# Patient Record
Sex: Male | Born: 1960 | Race: White | Hispanic: No | Marital: Single | Smoking: Current every day smoker
Health system: Southern US, Community
[De-identification: ages and names within clinical notes are randomized; demographics above are authoritative.]

## PROBLEM LIST (undated history)

## (undated) DIAGNOSIS — M549 Dorsalgia, unspecified: Secondary | ICD-10-CM

## (undated) HISTORY — PX: LUMBAR DISC SURGERY: SHX700

---

## 2000-12-11 ENCOUNTER — Emergency Department (HOSPITAL_COMMUNITY): Admission: EM | Admit: 2000-12-11 | Discharge: 2000-12-11 | Payer: Self-pay | Admitting: *Deleted

## 2000-12-12 ENCOUNTER — Emergency Department (HOSPITAL_COMMUNITY): Admission: EM | Admit: 2000-12-12 | Discharge: 2000-12-12 | Payer: Self-pay | Admitting: *Deleted

## 2011-09-18 ENCOUNTER — Encounter (HOSPITAL_COMMUNITY): Payer: Self-pay | Admitting: *Deleted

## 2011-09-18 ENCOUNTER — Emergency Department (HOSPITAL_COMMUNITY)
Admission: EM | Admit: 2011-09-18 | Discharge: 2011-09-18 | Disposition: A | Discharge status: Home / Self Care | Payer: BC Managed Care – PPO | Attending: Emergency Medicine | Admitting: Emergency Medicine

## 2011-09-18 DIAGNOSIS — F17200 Nicotine dependence, unspecified, uncomplicated: Secondary | ICD-10-CM | POA: Insufficient documentation

## 2011-09-18 DIAGNOSIS — K4090 Unilateral inguinal hernia, without obstruction or gangrene, not specified as recurrent: Secondary | ICD-10-CM | POA: Insufficient documentation

## 2011-09-18 HISTORY — DX: Dorsalgia, unspecified: M54.9

## 2011-09-18 NOTE — ED Provider Notes (Signed)
History  This chart was scribed for Joya Gaskins, MD by Bennett Scrape. This patient was seen in room APA18/[facility]18 and the patient's care was started at 10:22AM.  CSN: [id number]  Arrival date & time 09/18/11  1015   First MD Initiated Contact with Patient 09/18/11 1022      Chief Complaint  Patient presents with  . Abdominal Pain    Patient is a 51 y.o. male presenting with groin pain. The history is provided by the patient. No language interpreter was used.  Groin Pain This is a new problem. The current episode started more than 1 week ago. The problem occurs constantly. The problem has been gradually worsening. The symptoms are aggravated by walking. The symptoms are relieved by rest. He has tried nothing for the symptoms.    CHADDRICK BRUE is a 51 y.o. male who presents to the Emergency Department complaining of 2 months of gradual onset, gradually worsening, constant right inguinal pain with a noticeable bulge in the area with standing. The pain is non-radiating He describes the pain as being similar to a pulled muscle that is worse with use and better with rest. He has not seen a MD for the symptoms prior to today. He states that he has chronic back pain and urinary frequency but denies any change in the symptoms. He denies fever, emesis, constipation, abdominal pain, urgency, and dysuria as associated symptoms. He states that he has been having normal BMs. He denies having prior abdominal surgeries. He has no h/o chronic medical conditions. He is a current everyday smoker and occasional alcohol user.  Pt works as a Scientist, clinical (histocompatibility and immunogenetics) for [employer]  Past Medical History  Diagnosis Date  . Back pain     History reviewed. No pertinent past surgical history.  No family history on file.  History  Substance Use Topics  . Smoking status: Current Everyday Smoker  . Smokeless tobacco: Not on file  . Alcohol Use: Yes      Review of Systems  A complete 10 system review of  systems was obtained and all systems are negative except as noted in the HPI and PMH.   Allergies  Review of patient's allergies indicates not on file.  Home Medications  No current outpatient prescriptions on file.  Triage Vitals: BP 151/98  Pulse 86  Temp 98.1 F (36.7 C) (Oral)  Resp 16  SpO2 98%  Physical Exam  Nursing note and vitals reviewed.  CONSTITUTIONAL: Well developed/well nourished HEAD AND FACE: Normocephalic/atraumatic EYES: EOMI/PERRL ENMT: Mucous membranes moist NECK: supple no meningeal signs SPINE:entire spine nontender CV: S1/S2 noted, no murmurs/rubs/gallops noted LUNGS: Lungs are clear to auscultation bilaterally, no apparent distress ABDOMEN: soft, nontender, no rebound or guarding GU:no cva tenderness, bilateral inguinal hernias that are easily reducible.  No overlying warmth/erythema.  No testicular tenderness noted NEURO: Pt is awake/alert, moves all extremitiesx4 EXTREMITIES: pulses normal, full ROM SKIN: warm, color normal PSYCH: no abnormalities of mood noted  ED Course  Procedures   DIAGNOSTIC STUDIES: Oxygen Saturation is 98% on room air, normal by my interpretation.    COORDINATION OF CARE: 10:30AM-Discussed discharge plan with pt and pt agreed to plan. I will provide a referral to a surgeon to have the hernias repaired.   No signs of incarcerated/strangulated hernia  The patient appears reasonably screened and/or stabilized for discharge and I doubt any other medical condition or other [facility] requiring further screening, evaluation, or treatment in the ED at this time prior to discharge.  MDM  Nursing notes including past medical history and social history reviewed and considered in documentation       I personally performed the services described in this documentation, which was scribed in my presence. The recorded information has been reviewed and considered.      Joya Gaskins, MD 09/18/11 1045

## 2011-09-18 NOTE — ED Notes (Signed)
Pt states "lump, possible hernia" to right groin area. First noticed 2 mo ago.

## 2011-09-26 ENCOUNTER — Encounter (HOSPITAL_COMMUNITY): Payer: Self-pay | Admitting: Pharmacy Technician

## 2011-09-26 NOTE — H&P (Signed)
  NTS SOAP Note  Vital Signs:  Vitals as of: 09/26/2011: Systolic 153: Diastolic 94: Heart Rate 85: Temp 98.49F: Height 65ft 9.5in: Weight 124Lbs 0 Ounces: Pain Level 5: BMI 18  BMI : 18.05 kg/m2  Subjective: This 89 Years 68 Months old Male presents for of    HERNIA: ,Has had right groin pain for the past two months.  States it occurred soon after lifting something heavy.  No nausea, vomiting.  Made worse with straining.  Review of Symptoms:  Constitutional:unremarkable   Head:unremarkable    Eyes:  pain bilateral Nose/Mouth/Throat:unremarkable Cardiovascular:  unremarkable   Respiratory:  dyspnea,cough Gastrointestin    abdominal pain Genitourinary:    urinary hesitancy   joint and back pain Skin:unremarkable Hematolgic/Lymphatic:unremarkable     Allergic/Immunologic:unremarkable     Past Medical History:    Reviewed   Past Medical History  Surgical History: none Medical Problems: none Allergies: nkda Medications: none   Social History:Reviewed  Social History  Preferred Language: English ([place]) Race:  White Ethnicity: Not Hispanic / Latino Age: 51 Years 8 Months Marital Status:  S Alcohol:  Yes, How much 6 pack per day  Recreational drug(s):  No   Smoking Status: Current every day smoker reviewed on 09/26/2011 Started Date: 04/08/1978 Packs per day: 1.00   Family History:  Reviewed   Family History  Is there a family history ZO:[id number]    Objective Information: General:  Well appearing, well nourished in no distress. Head:Atraumatic; no masses; no abnormalities Neck:  Supple without lymphadenopathy.  Heart:  RRR, no murmur or gallop.  Normal S1, S2.  No S3, S4.  Lungs:    CTA bilaterally, no wheezes, rhonchi, rales.  Breathing unlabored. Abdomen:Soft, NT/ND, normal bowel sounds, no HSM, no masses.  No peritoneal signs.  Reducible right inguinal hernia, weak left inguinal  floor. XB:[id number]    Assessment:Right inguinal hernia  Diagnosis &amp; Procedure: DiagnosisCode: 550.90, ProcedureCode: 86578,    Plan:Scheduled for right inguinal herniorrhaphy on 10/02/11.   Patient Education:Alternative treatments to surgery were discussed with patient (and family).  Risks and benefits  of procedure were fully explained to the patient (and family) who gave informed consent. Patient/family questions were addressed.  Follow-up:Pending Surgery

## 2011-09-27 NOTE — Patient Instructions (Addendum)
Douglas Holt  09/27/2011   Your procedure is scheduled on:  10/02/2011  Report to [name] at  8;35 AM.  Call this number if you have problems the morning of surgery: [phone]   Remember:   Do not eat or drink:After Midnight.   Take these medicines the morning of surgery with A SIP OF WATER: No medications morning of surgery.   Do not wear jewelry, make-up or nail polish.  Do not wear lotions, powders, or perfumes. You may wear deodorant.  Do not shave 48 hours prior to surgery. Men may shave face and neck.  Do not bring valuables to the hospital.  Contacts, dentures or bridgework may not be worn into surgery.   Patients discharged the day of surgery will not be allowed to drive home.   Special Instructions: CHG Shower Use Special Wash: 1/2 bottle night before surgery and 1/2 bottle morning of surgery.   Please read over the following fact sheets that you were given: Pain Booklet, MRSA Information, Surgical Site Infection Prevention, Anesthesia Post-op Instructions and Care and Recovery After Surgery    Hernia, Surgical Repair A hernia occurs when an internal organ pushes out through a weak spot in the belly (abdominal) wall muscles. Hernias commonly occur in the groin and around the navel. Hernias often can be pushed back into place (reduced). Most hernias tend to get worse over time. Problems occur when abdominal contents get stuck in the opening (incarcerated hernia). The blood supply gets cut off (strangulated hernia). This is an emergency and needs surgery. Otherwise, hernia repair can be an elective procedure. This means you can schedule this at your convenience when an emergency is not present. Because complications can occur, if you decide to repair the hernia, it is best to do it soon. When it becomes an emergency procedure, there is increased risk of complications after surgery. CAUSES   Heavy lifting.   Obesity.   Prolonged coughing.   Straining to move your  bowels.   Hernias can also occur through a cut (incision) by a surgeonafter an abdominal operation.  HOME CARE INSTRUCTIONS Before the repair:  Bed rest is not required. You may continue your normal activities, but avoid heavy lifting (more than 10 pounds) or straining. Cough gently. If you are a smoker, it is best to stop. Even the best hernia repair can break down with the continual strain of coughing.   Do not wear anything tight over your hernia. Do not try to keep it in with an outside bandage or truss. These can damage abdominal contents if they are trapped in the hernia sac.   Eat a normal diet. Avoid constipation. Straining over long periods of time to have a bowel movement will increase hernia size. It also can breakdown repairs. If you cannot do this with diet alone, laxatives or stool softeners may be used.  PRIOR TO SURGERY, SEEK IMMEDIATE MEDICAL CARE IF: You have problems (symptoms) of a trapped (incarcerated) hernia. Symptoms include:  An oral temperature above 102 F (38.9 C) develops, or as your caregiver suggests.   Increasing abdominal pain.   Feeling sick to your stomach(nausea) and vomiting.   You stop passing gas or stool.   The hernia is stuck outside the abdomen, looks discolored, feels hard, or is tender.   You have any changes in your bowel habits or in the hernia that is unusual for you.  LET YOUR CAREGIVERS KNOW ABOUT THE FOLLOWING:  Allergies.   Medications  taken including herbs, eye drops, over the counter medications, and creams.   Use of steroids (by mouth or creams).   Family or personal history of problems with anesthetics or Novocaine.   Possibility of pregnancy, if this applies.   Personal history of blood clots (thrombophlebitis).   Family or personal history of bleeding or blood problems.   Previous surgery.   Other health problems.  BEFORE THE PROCEDURE You should be present 1 hour prior to your procedure, or as directed by your  caregiver.  AFTER THE PROCEDURE After surgery, you will be taken to the recovery area. A nurse will watch and check your progress there. Once you are awake, stable, and taking fluids well, you will be allowed to go home as long as there are no problems. Once home, an ice pack (wrapped in a light towel) applied to your operative site may help with discomfort. It may also keep the swelling down. Do not lift anything heavier than 10 pounds (4.55 kilograms). Take showers not baths. Do not drive while taking narcotics. Follow instructions as suggested by your caregiver.  SEEK IMMEDIATE MEDICAL CARE IF: After surgery:  There is redness, swelling, or increasing pain in the wound.   There is pus coming from the wound.   There is drainage from a wound lasting longer than 1 day.   An unexplained oral temperature above 102 F (38.9 C) develops.   You notice a foul smell coming from the wound or dressing.   There is a breaking open of a wound (edged not staying together) after the sutures have been removed.   You notice increasing pain in the shoulders (shoulder strap areas).   You develop dizzy episodes or fainting while standing.   You develop persistent nausea or vomiting.   You develop a rash.   You have difficulty breathing.   You develop any reaction or side effects to medications given.  MAKE SURE YOU:   Understand these instructions.   Will watch your condition.   Will get help right away if you are not doing well or get worse.  Document Released: 09/18/2000 Document Revised: 03/14/2011 Document Reviewed: 08/11/2007 [facility] Patient Information 2012 [place], [place].    PATIENT INSTRUCTIONS POST-ANESTHESIA  IMMEDIATELY FOLLOWING SURGERY:  Do not drive or operate machinery for the first twenty four hours after surgery.  Do not make any important decisions for twenty four hours after surgery or while taking narcotic pain medications or sedatives.  If you develop intractable  nausea and vomiting or a severe headache please notify your doctor immediately.  FOLLOW-UP:  Please make an appointment with your surgeon as instructed. You do not need to follow up with anesthesia unless specifically instructed to do so.  WOUND CARE INSTRUCTIONS (if applicable):  Keep a dry clean dressing on the anesthesia/puncture wound site if there is drainage.  Once the wound has quit draining you may leave it open to air.  Generally you should leave the bandage intact for twenty four hours unless there is drainage.  If the epidural site drains for more than 36-48 hours please call the anesthesia department.  QUESTIONS?:  Please feel free to call your physician or the hospital operator if you have any questions, and they will be happy to assist you.

## 2011-09-30 ENCOUNTER — Encounter (HOSPITAL_COMMUNITY)
Admission: RE | Admit: 2011-09-30 | Discharge: 2011-09-30 | Disposition: A | Discharge status: Home / Self Care | Payer: BC Managed Care – PPO | Source: Ambulatory Visit | Attending: General Surgery | Admitting: General Surgery

## 2011-09-30 ENCOUNTER — Encounter (HOSPITAL_COMMUNITY): Payer: Self-pay

## 2011-09-30 LAB — BASIC METABOLIC PANEL
Chloride: 90 mEq/L — ABNORMAL LOW (ref 96–112)
Creatinine, Ser: 0.81 mg/dL (ref 0.50–1.35)
GFR calc Af Amer: >90 mL/min (ref >90)
Potassium: 4.7 mEq/L (ref 3.5–5.1)

## 2011-09-30 LAB — DIFFERENTIAL
Basophils Absolute: 0 10*3/uL (ref 0.0–0.1)
Lymphocytes Relative: 15 % (ref 12–46)
Monocytes Absolute: 0.6 10*3/uL (ref 0.1–1.0)
Neutro Abs: 3.6 10*3/uL (ref 1.7–7.7)
Neutrophils Relative %: 72 % (ref 43–77)

## 2011-09-30 LAB — CBC
HCT: 40.3 % (ref 39.0–52.0)
Platelets: 201 10*3/uL (ref 150–400)
RDW: 12.7 % (ref 11.5–15.5)
WBC: 5 10*3/uL (ref 4.0–10.5)

## 2011-09-30 LAB — SURGICAL PCR SCREEN
MRSA, PCR: NEGATIVE
Staphylococcus aureus: NEGATIVE

## 2011-10-01 NOTE — Progress Notes (Signed)
Upon reviewing pt's pre-op labwork, sodium level was seen to be 126. Dr. Jayme Cloud notified and no orders given. He said he would evaluate pt morning of surgery.

## 2011-10-02 ENCOUNTER — Ambulatory Visit (HOSPITAL_COMMUNITY): Payer: BC Managed Care – PPO | Admitting: Anesthesiology

## 2011-10-02 ENCOUNTER — Encounter (HOSPITAL_COMMUNITY)
Admission: RE | Disposition: A | Discharge status: Home / Self Care | Payer: Self-pay | Source: Ambulatory Visit | Attending: General Surgery

## 2011-10-02 ENCOUNTER — Encounter (HOSPITAL_COMMUNITY): Payer: Self-pay | Admitting: Anesthesiology

## 2011-10-02 ENCOUNTER — Ambulatory Visit (HOSPITAL_COMMUNITY)
Admission: RE | Admit: 2011-10-02 | Discharge: 2011-10-02 | Disposition: A | Discharge status: Home / Self Care | Payer: BC Managed Care – PPO | Source: Ambulatory Visit | Attending: General Surgery | Admitting: General Surgery

## 2011-10-02 DIAGNOSIS — J449 Chronic obstructive pulmonary disease, unspecified: Secondary | ICD-10-CM | POA: Insufficient documentation

## 2011-10-02 DIAGNOSIS — J4489 Other specified chronic obstructive pulmonary disease: Secondary | ICD-10-CM | POA: Insufficient documentation

## 2011-10-02 DIAGNOSIS — K4090 Unilateral inguinal hernia, without obstruction or gangrene, not specified as recurrent: Secondary | ICD-10-CM | POA: Insufficient documentation

## 2011-10-02 HISTORY — PX: INGUINAL HERNIA REPAIR: SHX194

## 2011-10-02 SURGERY — REPAIR, HERNIA, INGUINAL, ADULT
Anesthesia: General | Site: Groin | Laterality: Right | Wound class: Clean

## 2011-10-02 MED ORDER — MIDAZOLAM HCL 2 MG/2ML IJ SOLN
INTRAMUSCULAR | Status: AC
  Administered 2011-10-02: 2 mg via INTRAVENOUS
  Filled 2011-10-02: qty 2

## 2011-10-02 MED ORDER — BUPIVACAINE HCL (PF) 0.5 % IJ SOLN
INTRAMUSCULAR | Status: DC | PRN
  Administered 2011-10-02: 9 mL

## 2011-10-02 MED ORDER — BUPIVACAINE HCL (PF) 0.5 % IJ SOLN
INTRAMUSCULAR | Status: AC
  Filled 2011-10-02: qty 30

## 2011-10-02 MED ORDER — CEFAZOLIN SODIUM-DEXTROSE 2-3 GM-% IV SOLR: 2.0000 g | INTRAVENOUS | Status: DC

## 2011-10-02 MED ORDER — LACTATED RINGERS IV SOLN
INTRAVENOUS | Status: DC
  Administered 2011-10-02: 1000 mL via INTRAVENOUS

## 2011-10-02 MED ORDER — PROPOFOL 10 MG/ML IV EMUL
INTRAVENOUS | Status: AC
  Filled 2011-10-02: qty 20

## 2011-10-02 MED ORDER — KETOROLAC TROMETHAMINE 30 MG/ML IJ SOLN
INTRAMUSCULAR | Status: AC
  Filled 2011-10-02: qty 1

## 2011-10-02 MED ORDER — ENOXAPARIN SODIUM 40 MG/0.4ML ~~LOC~~ SOLN
40.0000 mg | Freq: Once | SUBCUTANEOUS | Status: AC
  Administered 2011-10-02: 40 mg via SUBCUTANEOUS

## 2011-10-02 MED ORDER — CEFAZOLIN SODIUM-DEXTROSE 2-3 GM-% IV SOLR
INTRAVENOUS | Status: AC
  Filled 2011-10-02: qty 50

## 2011-10-02 MED ORDER — OXYCODONE-ACETAMINOPHEN 7.5-325 MG PO TABS: 1.0000 | ORAL_TABLET | ORAL | Status: AC | PRN

## 2011-10-02 MED ORDER — FENTANYL CITRATE 0.05 MG/ML IJ SOLN
INTRAMUSCULAR | Status: AC
  Filled 2011-10-02: qty 2

## 2011-10-02 MED ORDER — FENTANYL CITRATE 0.05 MG/ML IJ SOLN
INTRAMUSCULAR | Status: DC | PRN
  Administered 2011-10-02 (×2): 50 ug via INTRAVENOUS

## 2011-10-02 MED ORDER — KETOROLAC TROMETHAMINE 30 MG/ML IJ SOLN
30.0000 mg | Freq: Once | INTRAMUSCULAR | Status: AC
  Administered 2011-10-02: 30 mg via INTRAVENOUS

## 2011-10-02 MED ORDER — MIDAZOLAM HCL 2 MG/2ML IJ SOLN
1.0000 mg | INTRAMUSCULAR | Status: DC | PRN
  Administered 2011-10-02 (×2): 2 mg via INTRAVENOUS

## 2011-10-02 MED ORDER — PROPOFOL 10 MG/ML IV BOLUS
INTRAVENOUS | Status: DC | PRN
Start: 2011-10-02 — End: 2011-10-02
  Administered 2011-10-02: 150 mg via INTRAVENOUS

## 2011-10-02 MED ORDER — CEFAZOLIN SODIUM 1-5 GM-% IV SOLN
INTRAVENOUS | Status: DC | PRN
  Administered 2011-10-02: 1 g via INTRAVENOUS

## 2011-10-02 MED ORDER — ONDANSETRON HCL 4 MG/2ML IJ SOLN
INTRAMUSCULAR | Status: AC
  Administered 2011-10-02: 4 mg via INTRAVENOUS
  Filled 2011-10-02: qty 2

## 2011-10-02 MED ORDER — CEFAZOLIN SODIUM 1-5 GM-% IV SOLN
INTRAVENOUS | Status: AC
  Filled 2011-10-02: qty 50

## 2011-10-02 MED ORDER — MIDAZOLAM HCL 2 MG/2ML IJ SOLN
INTRAMUSCULAR | Status: AC
  Filled 2011-10-02: qty 2

## 2011-10-02 MED ORDER — ONDANSETRON HCL 4 MG/2ML IJ SOLN
4.0000 mg | Freq: Once | INTRAMUSCULAR | Status: AC
  Administered 2011-10-02: 4 mg via INTRAVENOUS

## 2011-10-02 MED ORDER — SODIUM CHLORIDE 0.9 % IR SOLN
Status: DC | PRN
  Administered 2011-10-02: 1000 mL

## 2011-10-02 MED ORDER — ONDANSETRON HCL 4 MG/2ML IJ SOLN: 4.0000 mg | Freq: Once | INTRAMUSCULAR | Status: DC | PRN

## 2011-10-02 MED ORDER — FENTANYL CITRATE 0.05 MG/ML IJ SOLN: 25.0000 ug | INTRAMUSCULAR | Status: DC | PRN

## 2011-10-02 MED ORDER — ENOXAPARIN SODIUM 40 MG/0.4ML ~~LOC~~ SOLN
SUBCUTANEOUS | Status: AC
  Administered 2011-10-02: 40 mg via SUBCUTANEOUS
  Filled 2011-10-02: qty 0.4

## 2011-10-02 MED ORDER — LIDOCAINE HCL 1 % IJ SOLN
INTRAMUSCULAR | Status: DC | PRN
  Administered 2011-10-02: 50 mg via INTRADERMAL

## 2011-10-02 MED ORDER — CEFAZOLIN SODIUM 1-5 GM-% IV SOLN: 1.0000 g | Freq: Once | INTRAVENOUS | Status: DC

## 2011-10-02 MED ORDER — MIDAZOLAM HCL 5 MG/5ML IJ SOLN
INTRAMUSCULAR | Status: DC | PRN
  Administered 2011-10-02: 2 mg via INTRAVENOUS

## 2011-10-02 SURGICAL SUPPLY — 41 items
ADH SKN CLS APL DERMABOND .7 (GAUZE/BANDAGES/DRESSINGS) ×1
BAG HAMPER (MISCELLANEOUS) ×2 IMPLANT
CLOTH BEACON ORANGE TIMEOUT ST (SAFETY) ×2 IMPLANT
COVER LIGHT HANDLE STERIS (MISCELLANEOUS) ×4 IMPLANT
DECANTER SPIKE VIAL GLASS SM (MISCELLANEOUS) ×2 IMPLANT
DERMABOND ADVANCED (GAUZE/BANDAGES/DRESSINGS) ×1
DERMABOND ADVANCED .7 DNX12 (GAUZE/BANDAGES/DRESSINGS) ×1 IMPLANT
DRAIN PENROSE 18X.75 LTX STRL (MISCELLANEOUS) ×2 IMPLANT
ELECT REM PT RETURN 9FT ADLT (ELECTROSURGICAL) ×2
ELECTRODE REM PT RTRN 9FT ADLT (ELECTROSURGICAL) ×1 IMPLANT
FORMALIN 10 PREFIL 120ML (MISCELLANEOUS) IMPLANT
GLOVE BIO SURGEON STRL SZ7.5 (GLOVE) ×2 IMPLANT
GLOVE BIOGEL PI IND STRL 7.0 (GLOVE) IMPLANT
GLOVE BIOGEL PI INDICATOR 7.0 (GLOVE) ×2
GLOVE ECLIPSE 6.5 STRL STRAW (GLOVE) ×2 IMPLANT
GLOVE EXAM NITRILE MD LF STRL (GLOVE) ×1 IMPLANT
GOWN STRL REIN XL XLG (GOWN DISPOSABLE) ×6 IMPLANT
INST SET MINOR GENERAL (KITS) ×2 IMPLANT
KIT ROOM TURNOVER APOR (KITS) ×2 IMPLANT
MANIFOLD NEPTUNE II (INSTRUMENTS) ×2 IMPLANT
MESH MARLEX PLUG MEDIUM (Mesh General) ×1 IMPLANT
NDL HYPO 25X1 1.5 SAFETY (NEEDLE) ×1 IMPLANT
NEEDLE HYPO 25X1 1.5 SAFETY (NEEDLE) ×2 IMPLANT
NS IRRIG 1000ML POUR BTL (IV SOLUTION) ×2 IMPLANT
PACK MINOR (CUSTOM PROCEDURE TRAY) ×2 IMPLANT
PAD ARMBOARD 7.5X6 YLW CONV (MISCELLANEOUS) ×2 IMPLANT
PAIN PUMP ON-Q 100MLX2ML 2.5IN (PAIN MANAGEMENT) IMPLANT
SET BASIN LINEN APH (SET/KITS/TRAYS/PACK) ×2 IMPLANT
SOL PREP PROV IODINE SCRUB 4OZ (MISCELLANEOUS) ×2 IMPLANT
SUT NOVA NAB GS-22 2 2-0 T-19 (SUTURE) ×2 IMPLANT
SUT NOVAFIL NAB HGS22 2-0 30IN (SUTURE) IMPLANT
SUT SILK 3 0 (SUTURE)
SUT SILK 3-0 18XBRD TIE 12 (SUTURE) ×1 IMPLANT
SUT VIC AB 2-0 CT1 27 (SUTURE) ×2
SUT VIC AB 2-0 CT1 TAPERPNT 27 (SUTURE) ×1 IMPLANT
SUT VIC AB 3-0 SH 27 (SUTURE) ×2
SUT VIC AB 3-0 SH 27X BRD (SUTURE) ×1 IMPLANT
SUT VIC AB 4-0 PS2 27 (SUTURE) ×2 IMPLANT
SUT VICRYL AB 3 0 TIES (SUTURE) IMPLANT
SYR CONTROL 10ML LL (SYRINGE) ×2 IMPLANT
TOWEL OR 17X26 4PK STRL BLUE (TOWEL DISPOSABLE) ×2 IMPLANT

## 2011-10-02 NOTE — Discharge Instructions (Signed)
Hernia, Surgical Repair Care After Refer to this sheet in the next few weeks. These discharge instructions provide you with general information on caring for yourself after you leave the hospital. Your caregiver may also give you specific instructions. Your treatment has been planned according to the most current medical practices available, but unavoidable complications sometimes occur. If you have any problems or questions after discharge, please call your caregiver. HOME CARE INSTRUCTIONS   It is normal to be sore for a couple weeks after surgery. See your caregiver if this seems to be getting worse rather than better.   Put ice on the operative site.   Put ice in a plastic bag.   Place a towel between your skin and the bag.   Leave the ice on for 15 to 20 minutes at a time, 3 to 4 times a day for the first 2 days.   Change bandages (dressings) as directed.   Keep the wound dry and clean. The wound may be washed gently with soap and water. Gently blot or dab the wound dry. Do not take baths, use swimming pools, or use hot tubs for 10 days, or as directed by your caregiver.   Only take over-the-counter or prescription medicines for pain, discomfort, or fever as directed by your caregiver.   Continue your normal diet as directed.   Do not drive until your caregiver says it is okay.   Do not lift anything more than 10 pounds or play contact sports for 3 weeks, or as directed.   Make an appointment to see your caregiver for stitches (sutures) or staple removal when instructed.  SEEK MEDICAL CARE IF:   You have increased bleeding coming from the wounds.   You have blood in your stool.   You see redness, swelling, or have increasing pain in the wounds.   You have fluid (pus) coming from the wound.   You have an oral temperature above 102 F (38.9 C).   You notice a bad smell coming from the wound or dressing.   You develop lightheadedness or feel faint.  SEEK IMMEDIATE  MEDICAL CARE IF:   You develop a rash.   You have difficulty breathing.   You develop any reaction or side effects to medicines given.  MAKE SURE YOU:   Understand these instructions.   Will watch your condition.   Will get help right away if you are not doing well or get worse.  Document Released: 10/12/2004 Document Revised: 03/14/2011 Document Reviewed: 02/22/2009 [employer] Patient Information 2012 [employer]. 

## 2011-10-02 NOTE — Anesthesia Preprocedure Evaluation (Signed)
Anesthesia Evaluation  Patient identified by MRN, date of birth, ID band Patient awake    Reviewed: Allergy & Precautions, H&P , NPO status , Patient's Chart, lab work & pertinent test results  Airway Mallampati: III TM Distance: >3 FB Neck ROM: Full    Dental  (+) Edentulous Upper and Edentulous Lower   Pulmonary COPDCurrent Smoker,  + rhonchi         Cardiovascular negative cardio ROS  Rhythm:Regular Rate:Normal     Neuro/Psych    GI/Hepatic negative GI ROS,   Endo/Other    Renal/GU      Musculoskeletal   Abdominal   Peds  Hematology   Anesthesia Other Findings   Reproductive/Obstetrics                           Anesthesia Physical Anesthesia Plan  ASA: II  Anesthesia Plan: General   Post-op Pain Management:    Induction: Intravenous  Airway Management Planned: LMA  Additional Equipment:   Intra-op Plan:   Post-operative Plan: Extubation in OR  Informed Consent: I have reviewed the patients History and Physical, chart, labs and discussed the procedure including the risks, benefits and alternatives for the proposed anesthesia with the patient or authorized representative who has indicated his/her understanding and acceptance.     Plan Discussed with:   Anesthesia Plan Comments:         Anesthesia Quick Evaluation

## 2011-10-02 NOTE — Interval H&P Note (Signed)
History and Physical Interval Note:  10/02/2011 9:29 AM  Douglas Holt  has presented today for surgery, with the diagnosis of right inguinal hernia  The various methods of treatment have been discussed with the patient and family. After consideration of risks, benefits and other options for treatment, the patient has consented to  Procedure(s) (LRB): HERNIA REPAIR INGUINAL ADULT (Right) as a surgical intervention .  The patient's history has been reviewed, patient examined, no change in status, stable for surgery.  I have reviewed the patients' chart and labs.  Questions were answered to the patient's satisfaction.     Franky Macho A

## 2011-10-02 NOTE — Transfer of Care (Signed)
Immediate Anesthesia Transfer of Care Note  Patient: Douglas Holt  Procedure(s) Performed: Procedure(s) (LRB): HERNIA REPAIR INGUINAL ADULT (Right)  Patient Location: PACU  Anesthesia Type: General  Level of Consciousness: awake and patient cooperative  Airway & Oxygen Therapy: Patient Spontanous Breathing and Patient connected to face mask oxygen  Post-op Assessment: Report given to PACU RN, Post -op Vital signs reviewed and stable and Patient moving all extremities  Post vital signs: Reviewed and stable  Complications: No apparent anesthesia complications

## 2011-10-02 NOTE — Anesthesia Postprocedure Evaluation (Signed)
  Anesthesia Post-op Note  Patient: Douglas Holt  Procedure(s) Performed: Procedure(s) (LRB): HERNIA REPAIR INGUINAL ADULT (Right)  Patient Location: PACU  Anesthesia Type: General  Level of Consciousness: awake, alert , oriented and patient cooperative  Airway and Oxygen Therapy: Patient Spontanous Breathing  Post-op Pain: 5 /10, moderate  Post-op Assessment: Post-op Vital signs reviewed, Patient's Cardiovascular Status Stable, Respiratory Function Stable, Patent Airway, No signs of Nausea or vomiting and Pain level controlled  Post-op Vital Signs: Reviewed and stable  Complications: No apparent anesthesia complications

## 2011-10-02 NOTE — Anesthesia Procedure Notes (Signed)
Procedure Name: LMA Insertion Date/Time: 10/02/2011 9:44 AM Performed by: Despina Hidden Pre-anesthesia Checklist: Emergency Drugs available, Patient identified, Patient being monitored and Suction available Patient Re-evaluated:Patient Re-evaluated prior to inductionOxygen Delivery Method: Circle system utilized Preoxygenation: Pre-oxygenation with 100% oxygen Intubation Type: IV induction Ventilation: Mask ventilation without difficulty LMA: LMA inserted LMA Size: 4.0 Tube type: Oral Number of attempts: 1 Placement Confirmation: positive ETCO2 and breath sounds checked- equal and bilateral Tube secured with: Tape Dental Injury: Teeth and Oropharynx as per pre-operative assessment

## 2011-10-02 NOTE — Op Note (Signed)
Patient:  Douglas Holt  DOB:  05-27-1960  MRN:  [id number]   Preop Diagnosis:  Right inguinal hernia  Postop Diagnosis:  Same  Procedure:  Right inguinal herniorrhaphy  Surgeon:  Franky Macho, M.D.  Anes:  General  Indications:  Patient is a 51 year old white male presents with a symptomatic right inguinal hernia. The risks and benefits of the procedure including bleeding, infection, and a possibly recurrence of the hernia were fully explained to the patient, gave informed consent.  Procedure note:  Patient was placed in the supine position after induction of general anesthesia. The right groin region was prepped and draped using usual sterile technique with Betadine. Surgical site confirmation was performed.  A transverse incision was made in the right groin region down to the external oblique aponeuroses. This was incised to the external ring. The ilioinguinal nerve was identified and retracted inferiorly from the operative field. A Penrose drain was placed around the spermatic cord. The vase deferens was noted within the spermatic cord. A small indirect hernia sac was found. This was freed away from the spermatic cord and inverted. A large direct hernia was found. This was incised at its base and a medium size modified Bard PerFix plug was then inserted and secured to the transversalis fascia using 2-0 Novafil interrupted sutures. An onlay patch was then placed along the floor of inguinal canal and secured superiorly to the conjoined tendon and inferiorly to the shelving edge of Poupart's ligament using 2-0 Novafil interrupted sutures. The internal ring was recreated using a 2-0 Novafil interrupted suture. The external oblique aponeuroses was reapproximated using 2-0 Vicryl running suture. Subcutaneous layer was reapproximated using 3-0 Vicryl interrupted sutures. The skin was closed using a 4 Vicryl subcuticular suture. 0.5% Sensorcaine was instilled the surrounding wound. Dermabond was then  applied.  All tape and needle counts were correct the end of the procedure. Patient was awakened and transferred to PACU in stable condition.  Complications:  None  EBL:  Minimal  Specimen:  None

## 2011-10-04 ENCOUNTER — Encounter (HOSPITAL_COMMUNITY): Payer: Self-pay | Admitting: General Surgery

## 2021-08-01 ENCOUNTER — Other Ambulatory Visit: Payer: Self-pay

## 2021-08-01 ENCOUNTER — Encounter (HOSPITAL_BASED_OUTPATIENT_CLINIC_OR_DEPARTMENT_OTHER): Payer: Self-pay

## 2021-08-01 ENCOUNTER — Emergency Department (HOSPITAL_BASED_OUTPATIENT_CLINIC_OR_DEPARTMENT_OTHER)
Admission: EM | Admit: 2021-08-01 | Discharge: 2021-08-01 | Disposition: A | Discharge status: Home / Self Care | Payer: PPO | Attending: Emergency Medicine | Admitting: Emergency Medicine

## 2021-08-01 DIAGNOSIS — J029 Acute pharyngitis, unspecified: Secondary | ICD-10-CM | POA: Insufficient documentation

## 2021-08-01 DIAGNOSIS — F17200 Nicotine dependence, unspecified, uncomplicated: Secondary | ICD-10-CM | POA: Insufficient documentation

## 2021-08-01 DIAGNOSIS — R590 Localized enlarged lymph nodes: Secondary | ICD-10-CM | POA: Diagnosis not present

## 2021-08-01 NOTE — Discharge Instructions (Signed)
As we discussed with your tobacco use, chronic sore throat, and swollen lymph nodes on the right side I believe that you need some further investigation to rule out other causes of sore throat including malignancy/cancer, versus infection versus other abnormality.  You did not want to stay for any further investigation, declined any prescriptions other than requesting antibiotics today.  As we discussed I do not see any evidence of a bacterial infection that would benefit from antibiotics at this time.  I do recommend that you return to the emergency department or see another provider for evaluation of your throat pain, and to investigate for possible cancer.  You are welcome to return to this department at any time.  If you begin to have shortness of breath, difficulty swallowing please return immediately. ?

## 2021-08-01 NOTE — ED Triage Notes (Signed)
He c/o sore throat "since the Holidays" (Nov. 2022). He also c/o occasional "cough up a little blood".  He further tells me that he can feel a "lump" at right throat (under jaw) area. He is ambulatory and in no distress. His fingers are markedly cool; and he tells me he has hx of Raynaud's dis.and R.A. ?

## 2021-08-01 NOTE — ED Provider Notes (Signed)
?[facility] ?Provider Note ? ? ?CSN: [id number] ?Arrival date & time: 08/01/21  1315 ? ?  ? ?History ? ?Chief Complaint  ?Patient presents with  ? Sore Throat  ? ? ?Douglas Holt is a 61 y.o. male with a past medical history significant for Raynaud's, rheumatoid arthritis, heavy tobacco use at 2 packs daily for the last 40 years who presents with concern for sore throat, occasional hemoptysis, lump in the right throat under jaw area.  Patient denies any nausea, vomiting, fever, chills.  Patient reports that the sore throat has been ongoing for around 4 months, worse over the last month.  Patient denies previous known history of cancer.  Patient is very thin, cachectic, denies significant weight loss more than his usual.  He denies night sweats. ? ? ?Sore Throat ? ? ?  ? ?Home Medications ?Prior to Admission medications   ?Medication Sig Start Date End Date Taking? Authorizing Provider  ?aspirin 325 MG tablet Take 325 mg by mouth every 6 (six) hours as needed. For pain    [provider]  ?Tetrahydrozoline HCl (VISINE OP) Apply 1-2 drops to eye as needed. For red eyes    [provider]  ?   ? ?Allergies    ?Patient has no known allergies.   ? ?Review of Systems   ?Review of Systems  ?HENT:  Positive for sore throat.   ?All other systems reviewed and are negative. ? ?Physical Exam ?Updated Vital Signs ?BP 129/83 (BP Location: Right Arm)   Pulse 93   Temp 98.2 ?F (36.8 ?C) (Oral)   Resp 17   SpO2 100%  ?Physical Exam ?Vitals and nursing note reviewed.  ?Constitutional:   ?   General: He is not in acute distress. ?   Appearance: Normal appearance.  ?   Comments: Cachectic  ?HENT:  ?   Head: Normocephalic and atraumatic.  ?   Mouth/Throat:  ?   Tonsils: No tonsillar exudate or tonsillar abscesses. 1+ on the right. 1+ on the left.  ?   Comments: Patient with some discoloration of the tongue secondary to heavy tobacco use, do not know any obvious lesions in the oropharynx.   He has intact swallow reflex, uvula is midline.  Tonsils 1+ bilaterally. ?Eyes:  ?   General:     ?   Right eye: No discharge.     ?   Left eye: No discharge.  ?Neck:  ?   Comments: With significant right-sided cervical lymphadenopathy, including supraclavicular, multiple anterior and posterior cervical nodes. ?Cardiovascular:  ?   Rate and Rhythm: Normal rate and regular rhythm.  ?Pulmonary:  ?   Effort: Pulmonary effort is normal. No respiratory distress.  ?   Comments: Patient with thoracic silhouette consistent with chronic bronchitis versus emphysema, he has some occasional crackling without notable focal consolidation.  He is in no respiratory distress, he has no tachypnea noted today. ?Musculoskeletal:     ?   General: No deformity.  ?Skin: ?   General: Skin is warm and dry.  ?Neurological:  ?   Mental Status: He is alert and oriented to person, place, and time.  ?Psychiatric:     ?   Mood and Affect: Mood normal.     ?   Behavior: Behavior normal.  ? ? ?ED Results / Procedures / Treatments   ?Labs ?(all labs ordered are listed, but only abnormal results are displayed) ?Labs Reviewed - No data to display ? ?EKG ?None ? ?Radiology ?  No results found. ? ?Procedures ?Procedures  ? ? ?Medications Ordered in ED ?Medications - No data to display ? ?ED Course/ Medical Decision Making/ A&P ?  ?                        ?Medical Decision Making ? ?This patient presents to the ED for concern of sore throat, occasional hemoptysis, throat swelling, possible mass, this involves an extensive number of treatment options, and is a complaint that carries with it a high risk of complications and morbidity. The emergent differential diagnosis prior to evaluation includes, but is not limited to, malignancy, bacterial versus viral.  Additionally with patient's report of occasional hemoptysis considered possible lung cancer versus chronic PE versus other intrathoracic abnormality. ? ?This is not an exhaustive differential.  ? ?Past  Medical History / Co-morbidities / Social History: ?Heavy tobacco use, 80-pack-year history, rheumatoid arthritis, Raynaud's ? ?Additional history: ?Chart reviewed. Pertinent results include: Outpatient orthopedic visits. ? ?Physical Exam: ?Physical exam performed. The pertinent findings include: Patient with significant right-sided cervical lymphadenopathy, including supraclavicular node.  He does not have any evidence of clear tonsillar exudate, peritonsillar abscess or other intraoral abnormality.  He does have some discoloration of the tongue although I do not noted a leukoplakia or other lesions.  Concern for right-sided throat versus lung malignancy given history. ? ?Patient counseling: ?Discussed with patient that with his report of occasional hemoptysis, heavy tobacco use, right-sided lymphadenopathy, chronic throat pain I have concern for throat cancer, lung cancer as well as possible viral or bacterial illness.  Discussed with patient I recommend lab work, chest x-ray, CT soft tissue of the neck at the very least as well as COVID, flu, strep swabs.  Patient declines all testing at this time reporting that he is already we did an hour since check in and was just hoping for some antibiotics.  Discussed in depth with patient that I do not see any evidence of bacterial infection on my examination, cannot prescribe antibiotics for sore throat without known cause.  Discussed that given his tobacco use history he does have a high risk, high likelihood of possible malignancy and I recommend that he be evaluated further at this time.  Patient declines any further evaluation reports that he wants to go home.  Patient discharged in stable condition with extensive return precautions.  He is encouraged to return to the emergency department or to visit ENT at his earliest convenience for further evaluation. ?  ?Disposition: ?After consideration of the diagnostic results and the patients response to treatment, I feel  that patient has a clinical condition which is consistent with possible chronic sore throat versus new malignancy versus other.  He requires further investigation to further differentiate delineate these possibilities.  Patient declines any further testing, intervention at this time.  He requests discharge.  As patient does not have any acute respiratory distress, and stable vital signs, we will not discharge [place], however discussed that I do not recommend that patient leave these issues untreated.  Patient expresses understanding.  ? ?I discussed this case with my attending physician Dr. Roslynn Amble who cosigned this note including patient's presenting symptoms, physical exam, and planned diagnostics and interventions. Attending physician stated agreement with plan or made changes to plan which were implemented.   ? ?Final Clinical Impression(s) / ED Diagnoses ?Final diagnoses:  ?Sore throat  ?Cervical lymphadenopathy  ? ? ?Rx / DC Orders ?ED Discharge Orders   ? ? None  ? ?  ? ? ?  ?  Anselmo Pickler, PA-C ?08/01/21 1430 ? ?  ?Lucrezia Starch, MD ?08/03/21 [phone] ? ?

## 2021-08-15 ENCOUNTER — Encounter: Payer: Self-pay | Admitting: Registered Nurse

## 2021-08-15 ENCOUNTER — Ambulatory Visit (INDEPENDENT_AMBULATORY_CARE_PROVIDER_SITE_OTHER): Payer: PPO | Admitting: Registered Nurse

## 2021-08-15 VITALS — BP 133/99 | HR 99 | Temp 98.2°F | Resp 18 | Ht 69.5 in | Wt 119.2 lb

## 2021-08-15 DIAGNOSIS — F17210 Nicotine dependence, cigarettes, uncomplicated: Secondary | ICD-10-CM

## 2021-08-15 DIAGNOSIS — Z1329 Encounter for screening for other suspected endocrine disorder: Secondary | ICD-10-CM | POA: Diagnosis not present

## 2021-08-15 DIAGNOSIS — Z13220 Encounter for screening for lipoid disorders: Secondary | ICD-10-CM

## 2021-08-15 DIAGNOSIS — Z13228 Encounter for screening for other metabolic disorders: Secondary | ICD-10-CM

## 2021-08-15 DIAGNOSIS — Z1211 Encounter for screening for malignant neoplasm of colon: Secondary | ICD-10-CM

## 2021-08-15 DIAGNOSIS — J029 Acute pharyngitis, unspecified: Secondary | ICD-10-CM

## 2021-08-15 DIAGNOSIS — Z130 Encounter for screening for diseases of the blood and blood-forming organs and certain disorders involving the immune mechanism: Secondary | ICD-10-CM

## 2021-08-15 DIAGNOSIS — R221 Localized swelling, mass and lump, neck: Secondary | ICD-10-CM

## 2021-08-15 LAB — CBC WITH DIFFERENTIAL/PLATELET
Basophils Absolute: 0.1 10*3/uL (ref 0.0–0.1)
Basophils Relative: 1.2 % (ref 0.0–3.0)
Eosinophils Absolute: 0.1 10*3/uL (ref 0.0–0.7)
Eosinophils Relative: 1.1 % (ref 0.0–5.0)
HCT: 40.6 % (ref 39.0–52.0)
Hemoglobin: 13.3 g/dL (ref 13.0–17.0)
Lymphocytes Relative: 16.7 % (ref 12.0–46.0)
Lymphs Abs: 1.9 10*3/uL (ref 0.7–4.0)
MCHC: 32.7 g/dL (ref 30.0–36.0)
MCV: 81.7 fl (ref 78.0–100.0)
Monocytes Absolute: 1.7 10*3/uL — ABNORMAL HIGH (ref 0.1–1.0)
Monocytes Relative: 14.6 % — ABNORMAL HIGH (ref 3.0–12.0)
Neutro Abs: 7.6 10*3/uL (ref 1.4–7.7)
Neutrophils Relative %: 66.4 % (ref 43.0–77.0)
Platelets: 488 10*3/uL — ABNORMAL HIGH (ref 150.0–400.0)
RBC: 4.97 Mil/uL (ref 4.22–5.81)
RDW: 17 % — ABNORMAL HIGH (ref 11.5–15.5)
WBC: 11.5 10*3/uL — ABNORMAL HIGH (ref 4.0–10.5)

## 2021-08-15 LAB — COMPREHENSIVE METABOLIC PANEL
ALT: 16 U/L (ref 0–53)
AST: 26 U/L (ref 0–37)
Albumin: 2.9 g/dL — ABNORMAL LOW (ref 3.5–5.2)
Alkaline Phosphatase: 99 U/L (ref 39–117)
BUN: 9 mg/dL (ref 6–23)
CO2: 27 mEq/L (ref 19–32)
Calcium: 8.4 mg/dL (ref 8.4–10.5)
Chloride: 94 mEq/L — ABNORMAL LOW (ref 96–112)
Creatinine, Ser: 0.66 mg/dL (ref 0.40–1.50)
GFR: 102.61 mL/min (ref >60.00)
Glucose, Bld: 91 mg/dL (ref 70–99)
Potassium: 4.8 mEq/L (ref 3.5–5.1)
Sodium: 128 mEq/L — ABNORMAL LOW (ref 135–145)
Total Bilirubin: 0.4 mg/dL (ref 0.2–1.2)
Total Protein: 7.6 g/dL (ref 6.0–8.3)

## 2021-08-15 LAB — LIPID PANEL
Cholesterol: 113 mg/dL (ref 0–200)
HDL: 29 mg/dL — ABNORMAL LOW (ref >39.00)
LDL Cholesterol: 72 mg/dL (ref 0–99)
NonHDL: 84.13
Total CHOL/HDL Ratio: 4
Triglycerides: 60 mg/dL (ref 0.0–149.0)
VLDL: 12 mg/dL (ref 0.0–40.0)

## 2021-08-15 LAB — HEMOGLOBIN A1C: Hgb A1c MFr Bld: 5.9 % (ref 4.6–6.5)

## 2021-08-15 LAB — TSH: TSH: 5.15 u[IU]/mL (ref 0.35–5.50)

## 2021-08-15 MED ORDER — DOXYCYCLINE HYCLATE 100 MG PO TABS: 100.0000 mg | ORAL_TABLET | Freq: Two times a day (BID) | ORAL | 0 refills | Status: DC

## 2021-08-15 MED ORDER — PREDNISONE 10 MG (21) PO TBPK: ORAL_TABLET | ORAL | 0 refills | Status: DC

## 2021-08-15 NOTE — Patient Instructions (Addendum)
Douglas Holt -  ? ?Great to meet you ? ?Doxycycline and prednisone for sore throat. I am concerned about an underlying issue, and to be frank, I am worried about cancer given your smoking history. We should get a CT scan of the neck and the chest within 2 weeks. ? ?I will check on labs today. I'll call you if results are worrisome. ? ?Let's touch base in 2 months for labs and a physical ? ? ?Thanks, ? ?Rich  ? ? ? ?If you have lab work done today you will be contacted with your lab results within the next 2 weeks.  If you have not heard from [place] then please contact us. The fastest way to get your results is to register for My Chart. ? ? ?IF you received an x-ray today, you will receive an invoice from [facility]. Please contact [facility] at [phone] with questions or concerns regarding your invoice.  ? ?IF you received labwork today, you will receive an invoice from [place]. Please contact [facility] at [phone] with questions or concerns regarding your invoice.  ? ?Our billing staff will not be able to assist you with questions regarding bills from these companies. ? ?You will be contacted with the lab results as soon as they are available. The fastest way to get your results is to activate your My Chart account. Instructions are located on the last page of this paperwork. If you have not heard from [place] regarding the results in 2 weeks, please contact this office. ?  ? ? ?

## 2021-08-15 NOTE — Progress Notes (Signed)
? ?New Patient Office Visit ? ?Subjective:  ?Patient ID: Douglas Holt, male    DOB: [birth year]  Age: 61 y.o. MRN: [id number] ? ?CC:  ?Chief Complaint  ?Patient presents with  ? New Patient (Initial Visit)  ?  Patient states he is here to establish care. Patient states he has been having a sore throat for about 1 month he felt like it would go away but it has not. Patient states around christmas he was throwing up blood so he is concerned.  ? ? ?HPI ?Douglas Holt presents to establish care ?Histories reviewed and updated with patient.  ? ?Concerns today for sore throat and hemoptysis, hematemesis.  ? ?Sore throat ?Onset 1 mo ago ?Worsening ?Swollen nodes ?No congestion or cough, no sick contacts. Has not treated. ?Does state he coughs up some phlegm occ ?No dysphagia, but does note globus sensation. ? ?Hemoptysis/Hematemesis ?Onset in late Dec. Resolved spontaneously  ?Did not get treated at that time.  ?Had not occurred before.  ?No hematochezia or melena.  ? ?Of note, recent ER visit at [facility] ?Seen by Dr. Roslynn Amble for sore throat ?Reported onset 4 months ago with lump in R throat under jaw area.  ?He declined further work up including all labs and imaging ?Dr. Roslynn Amble noted no evidence of infection and declined abx  ?He reviewed in depth risk for cancer and risks of declining work up, pt expressed understanding at that time. ? ? ? ?Outpatient Encounter Medications as of 08/15/2021  ?Medication Sig  ? aspirin 325 MG tablet Take 325 mg by mouth every 6 (six) hours as needed. For pain  ? doxycycline (VIBRA-TABS) 100 MG tablet Take 1 tablet (100 mg total) by mouth 2 (two) times daily.  ? predniSONE (STERAPRED UNI-PAK 21 TAB) 10 MG (21) TBPK tablet Take per package instructions. Do not skip doses. Finish entire supply.  ? Tetrahydrozoline HCl (VISINE OP) Apply 1-2 drops to eye as needed. For red eyes  ? ?No facility-administered encounter medications on file as of 08/15/2021.  ? ? ?Past Medical History:   ?Diagnosis Date  ? Back pain   ? collasped disc  ? ? ?Past Surgical History:  ?Procedure Laterality Date  ? INGUINAL HERNIA REPAIR  10/02/2011  ? Procedure: HERNIA REPAIR INGUINAL ADULT;  Surgeon: Jamesetta So, MD;  Location: [facility];  Service: General;  Laterality: Right;  ? LUMBAR [place]    ? ? ?History reviewed. No pertinent family history. ? ?Social History  ? ?Socioeconomic History  ? Marital status: Single  ?  Spouse name: Not on file  ? Number of children: Not on file  ? Years of education: Not on file  ? Highest education level: Not on file  ?Occupational History  ?  Comment: disability for back pain  ?Tobacco Use  ? Smoking status: Every Day  ?  Packs/day: 2.00  ?  Years: 40.00  ?  Pack years: 80.00  ?  Types: Cigarettes  ? Smokeless tobacco: Not on file  ?Vaping Use  ? Vaping Use: Never used  ?Substance and Sexual Activity  ? Alcohol use: Yes  ?  Alcohol/week: 28.0 standard drinks  ?  Types: 28 Cans of beer per week  ?  Comment: 3-4 beers a day   ? Drug use: No  ? Sexual activity: Not Currently  ?Other Topics Concern  ? Not on file  ?Social History Narrative  ? Not on file  ? ?Social Determinants of Health  ? ?Financial Resource Strain:  Not on file  ?Food Insecurity: Not on file  ?Transportation Needs: Not on file  ?Physical Activity: Not on file  ?Stress: Not on file  ?Social Connections: Not on file  ?Intimate Partner Violence: Not on file  ? ? ?ROS ?Review of Systems  ?Constitutional: Negative.   ?HENT:  Positive for sore throat.   ?Eyes: Negative.   ?Respiratory: Negative.    ?Cardiovascular: Negative.   ?Gastrointestinal: Negative.   ?Endocrine: Negative.   ?Genitourinary: Negative.   ?Musculoskeletal: Negative.   ?Skin: Negative.   ?Allergic/Immunologic: Negative.   ?Neurological: Negative.   ?Hematological: Negative.   ?Psychiatric/Behavioral: Negative.    ?All other systems reviewed and are negative. ? ?Objective:  ? ?Today's Vitals: BP (!) 133/99   Pulse 99   Temp 98.2 ?F (36.8 ?C)  (Temporal)   Resp 18   Ht 5' 9.5" (1.765 m)   Wt 119 lb 3.2 oz (54.1 kg)   SpO2 99%   BMI 17.35 kg/m?  ? ?Physical Exam ?Constitutional:   ?   General: He is not in acute distress. ?   Appearance: Normal appearance. He is normal weight. He is not ill-appearing, toxic-appearing or diaphoretic.  ?HENT:  ?   Mouth/Throat:  ?   Mouth: Mucous membranes are moist.  ?   Pharynx: Oropharynx is clear.  ?   Comments: Discoloration consistent with long smoking history. No obvious oral lesions but pt guarded during exam. No remaining teeth. No obvious infection.  ?Cardiovascular:  ?   Rate and Rhythm: Normal rate and regular rhythm.  ?   Heart sounds: Normal heart sounds. No murmur heard. ?  No friction rub. No gallop.  ?Pulmonary:  ?   Effort: Pulmonary effort is normal. No respiratory distress.  ?   Breath sounds: No stridor. Wheezing (panlobular) present. No rhonchi or rales.  ?Chest:  ?   Chest wall: No tenderness.  ?Musculoskeletal:  ?   Comments: Question of edema in both hands. Pt states this is baseline, attributes to hx of Raynaud's.   ?Lymphadenopathy:  ?   Cervical: Cervical adenopathy (large firm painless nodes through R cervical chain and supraclavicular.) present.  ?Skin: ?   General: Skin is warm and dry.  ?   Coloration: Skin is not jaundiced or pale.  ?   Findings: No bruising, erythema, lesion or rash.  ?Neurological:  ?   General: No focal deficit present.  ?   Mental Status: He is alert and oriented to person, place, and time. Mental status is at baseline.  ?Psychiatric:     ?   Mood and Affect: Mood normal.     ?   Behavior: Behavior normal.     ?   Thought Content: Thought content normal.     ?   Judgment: Judgment normal.  ? ? ? ? ? ? ?Assessment & Plan:  ? ?Problem List Items Addressed This Visit   ?None ?Visit Diagnoses   ? ? Sore throat    -  Primary  ? Relevant Medications  ? doxycycline (VIBRA-TABS) 100 MG tablet  ? predniSONE (STERAPRED UNI-PAK 21 TAB) 10 MG (21) TBPK tablet  ? Neck mass      ?  Relevant Orders  ? CT Soft Tissue Neck W Contrast  ? Smokes less than 2 packs a day with greater than 40 pack year history      ? Relevant Orders  ? CT CHEST LUNG CA SCREEN LOW DOSE W/O CM  ? Screening for endocrine, metabolic and immunity  disorder      ? Relevant Orders  ? CBC with Differential/Platelet  ? Comprehensive metabolic panel  ? Hemoglobin A1c  ? TSH  ? Lipid screening      ? Relevant Orders  ? Lipid panel  ? Colon cancer screening      ? Relevant Orders  ? Cologuard  ? ?  ? ? ?Follow-up: Return in about 8 weeks (around 10/10/2021) for CPE and labs.  ? ?PLAN ?I had a long (46 minute) discussion with patient in regards to current symptoms, smoking history, and concern for cancer.  ?He feels very certain this is an infection. We agreed to pursue abx and prednisone if he would complete CT scan of neck and low dose CT chest consistent with lung ca screening guidelines ?He is agreeable to this plan ?Labs collected. Will follow up with the patient as warranted. ?Patient encouraged to call clinic with any questions, comments, or concerns. ? ? ?Maximiano Coss, NP ?

## 2021-08-16 ENCOUNTER — Encounter: Payer: Self-pay | Admitting: Registered Nurse

## 2021-08-27 ENCOUNTER — Telehealth: Payer: Self-pay

## 2021-08-27 NOTE — Telephone Encounter (Signed)
Please advise 

## 2021-08-27 NOTE — Telephone Encounter (Signed)
Received a call from Zeeshan who was saying he was supposed to receive a call from him about scheduling his CT scan. Advised patient that typically the patient will call, offered to provide patient the information of where to call but Brown said he might as well go to the hospital as we are refusing to help him. Explained to patient that we arent refusing to help him, that I was trying to explain our process. Lashon kept repeating that he didn't know what to do now that we dont want to help him, then patient decided to end the phone call and hung up.

## 2021-08-28 ENCOUNTER — Telehealth: Payer: Self-pay | Admitting: Registered Nurse

## 2021-08-28 NOTE — Telephone Encounter (Signed)
Looks like he's already scheduled for 09/04/21  Thanks,  Denice Paradise

## 2021-09-04 ENCOUNTER — Ambulatory Visit
Admission: RE | Admit: 2021-09-04 | Discharge: 2021-09-04 | Disposition: A | Discharge status: Home / Self Care | Payer: PPO | Source: Ambulatory Visit | Attending: Registered Nurse | Admitting: Registered Nurse

## 2021-09-04 DIAGNOSIS — R221 Localized swelling, mass and lump, neck: Secondary | ICD-10-CM

## 2021-09-04 DIAGNOSIS — F17210 Nicotine dependence, cigarettes, uncomplicated: Secondary | ICD-10-CM

## 2021-09-04 MED ORDER — IOPAMIDOL (ISOVUE-300) INJECTION 61%
75.0000 mL | Freq: Once | INTRAVENOUS | Status: AC | PRN
  Administered 2021-09-04: 75 mL via INTRAVENOUS

## 2021-09-05 ENCOUNTER — Other Ambulatory Visit: Payer: Self-pay | Admitting: Registered Nurse

## 2021-09-05 ENCOUNTER — Telehealth: Payer: Self-pay | Admitting: *Deleted

## 2021-09-05 ENCOUNTER — Telehealth: Payer: Self-pay | Admitting: Registered Nurse

## 2021-09-05 DIAGNOSIS — C760 Malignant neoplasm of head, face and neck: Secondary | ICD-10-CM

## 2021-09-05 DIAGNOSIS — D496 Neoplasm of unspecified behavior of brain: Secondary | ICD-10-CM

## 2021-09-05 NOTE — Telephone Encounter (Signed)
I called Douglas Holt, the brother of Douglas Holt, to communicate the imminent need for Douglas Holt to proceed to the ER via EMS or private vehicle ASAP based on rapid deterioration and concerning imaging.  I let him know that I had consulted with Dr. Benay Spice on the phone earlier today, who himself reviewed the imaging and echoed these sentiments.   Douglas Holt voiced his understanding and will try to communicate with Douglas Holt regarding this urgency. Douglas Holt had refused to me to go to the ER on a number of occasions.   Douglas Ruddy, NP

## 2021-09-05 NOTE — Telephone Encounter (Signed)
I called Mr. Mortellaro for the second time today again urging him to head to the ER via EMS. Given his rapid deterioration and concerning results on imaging, I am concerned that Mr. Sokolowski faces an imminent risk of death.   I have consulted with Dr. Benay Spice about this case earlier today. I let Mr. Gaffin know that this conversation had happened.  This message was left on his machine, however, he disconnected the call manually before I could finish the message.   Kathrin Ruddy, NP

## 2021-09-05 NOTE — Telephone Encounter (Signed)
[facility] called in with Stat results for CT of neck   Please review results asap

## 2021-09-05 NOTE — Telephone Encounter (Addendum)
Call from Dr. Lesia Sago office requesting "emergent referral" with Dr. Benay Holt for tomorrow. 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 Has had rapid decline with some speech impairment. He is refusing ER visit/hospitalization. Provided Dr. Benay Holt scan report and above message with Dr. Darien Ramus cell phone # to call to discuss case. Dr. Benay Holt discussed case with Douglas Coss, NP and was informed that he strongly encourages him to go to ER or be a direct admit now. He is in danger if he waits to have workup as outpatient. Needs to be inpatient where he can be diagnosed and treated promptly.

## 2021-09-05 NOTE — Telephone Encounter (Signed)
I have called and spoken to the patient, and a separate call was placed to his brother. They understand the results. I have advised that Mr. Douglas Holt proceed to the ER via EMS given his rapid decline and he voices understanding and absolute dissent with this recommendation. Urgent referral to Onc placed.  Kathrin Ruddy, NP

## 2021-09-05 NOTE — Telephone Encounter (Signed)
Spoke with Manuela Schwartz at [facility]  and she will have the provider call Rich to discuss this.

## 2021-09-06 ENCOUNTER — Inpatient Hospital Stay (HOSPITAL_COMMUNITY)
Admission: AD | Admit: 2021-09-06 | Discharge: 2021-09-12 | DRG: 040 | Disposition: A | Discharge status: Home / Self Care | Payer: PPO | Source: Ambulatory Visit | Attending: Internal Medicine | Admitting: Internal Medicine

## 2021-09-06 ENCOUNTER — Inpatient Hospital Stay: Payer: PPO

## 2021-09-06 ENCOUNTER — Ambulatory Visit
Admission: RE | Admit: 2021-09-06 | Discharge: 2021-09-06 | Disposition: A | Discharge status: Home / Self Care | Payer: PPO | Source: Ambulatory Visit | Attending: Radiation Oncology | Admitting: Radiation Oncology

## 2021-09-06 ENCOUNTER — Telehealth: Payer: Self-pay

## 2021-09-06 ENCOUNTER — Encounter: Payer: Self-pay | Admitting: Nurse Practitioner

## 2021-09-06 ENCOUNTER — Inpatient Hospital Stay: Payer: PPO | Attending: Nurse Practitioner | Admitting: Nurse Practitioner

## 2021-09-06 ENCOUNTER — Encounter: Payer: Self-pay | Admitting: *Deleted

## 2021-09-06 ENCOUNTER — Inpatient Hospital Stay: Payer: PPO | Admitting: Nurse Practitioner

## 2021-09-06 VITALS — BP 112/88 | HR 80 | Temp 97.8°F | Resp 18 | Ht 69.5 in | Wt 113.0 lb

## 2021-09-06 VITALS — BP 125/92 | HR 97 | Temp 97.9°F | Resp 18 | Ht 69.5 in | Wt 113.0 lb

## 2021-09-06 DIAGNOSIS — D75839 Thrombocytosis, unspecified: Secondary | ICD-10-CM | POA: Diagnosis present

## 2021-09-06 DIAGNOSIS — Z515 Encounter for palliative care: Secondary | ICD-10-CM

## 2021-09-06 DIAGNOSIS — F17210 Nicotine dependence, cigarettes, uncomplicated: Secondary | ICD-10-CM | POA: Diagnosis present

## 2021-09-06 DIAGNOSIS — Z66 Do not resuscitate: Secondary | ICD-10-CM | POA: Diagnosis present

## 2021-09-06 DIAGNOSIS — R4701 Aphasia: Secondary | ICD-10-CM | POA: Insufficient documentation

## 2021-09-06 DIAGNOSIS — G936 Cerebral edema: Secondary | ICD-10-CM | POA: Diagnosis present

## 2021-09-06 DIAGNOSIS — E43 Unspecified severe protein-calorie malnutrition: Secondary | ICD-10-CM | POA: Diagnosis present

## 2021-09-06 DIAGNOSIS — C3491 Malignant neoplasm of unspecified part of right bronchus or lung: Secondary | ICD-10-CM | POA: Diagnosis present

## 2021-09-06 DIAGNOSIS — Z79899 Other long term (current) drug therapy: Secondary | ICD-10-CM

## 2021-09-06 DIAGNOSIS — D72829 Elevated white blood cell count, unspecified: Secondary | ICD-10-CM | POA: Diagnosis present

## 2021-09-06 DIAGNOSIS — Z789 Other specified health status: Secondary | ICD-10-CM | POA: Diagnosis not present

## 2021-09-06 DIAGNOSIS — E8809 Other disorders of plasma-protein metabolism, not elsewhere classified: Secondary | ICD-10-CM | POA: Diagnosis present

## 2021-09-06 DIAGNOSIS — R918 Other nonspecific abnormal finding of lung field: Secondary | ICD-10-CM

## 2021-09-06 DIAGNOSIS — D630 Anemia in neoplastic disease: Secondary | ICD-10-CM | POA: Diagnosis present

## 2021-09-06 DIAGNOSIS — F410 Panic disorder [episodic paroxysmal anxiety] without agoraphobia: Secondary | ICD-10-CM | POA: Diagnosis present

## 2021-09-06 DIAGNOSIS — E871 Hypo-osmolality and hyponatremia: Secondary | ICD-10-CM | POA: Diagnosis present

## 2021-09-06 DIAGNOSIS — Z681 Body mass index (BMI) 19 or less, adult: Secondary | ICD-10-CM

## 2021-09-06 DIAGNOSIS — R471 Dysarthria and anarthria: Secondary | ICD-10-CM | POA: Insufficient documentation

## 2021-09-06 DIAGNOSIS — M069 Rheumatoid arthritis, unspecified: Secondary | ICD-10-CM | POA: Insufficient documentation

## 2021-09-06 DIAGNOSIS — E860 Dehydration: Secondary | ICD-10-CM | POA: Diagnosis present

## 2021-09-06 DIAGNOSIS — Z720 Tobacco use: Secondary | ICD-10-CM | POA: Diagnosis not present

## 2021-09-06 DIAGNOSIS — C7931 Secondary malignant neoplasm of brain: Secondary | ICD-10-CM | POA: Diagnosis present

## 2021-09-06 DIAGNOSIS — Z7189 Other specified counseling: Secondary | ICD-10-CM | POA: Diagnosis not present

## 2021-09-06 DIAGNOSIS — C3490 Malignant neoplasm of unspecified part of unspecified bronchus or lung: Secondary | ICD-10-CM | POA: Insufficient documentation

## 2021-09-06 DIAGNOSIS — C3411 Malignant neoplasm of upper lobe, right bronchus or lung: Secondary | ICD-10-CM | POA: Insufficient documentation

## 2021-09-06 DIAGNOSIS — Z5112 Encounter for antineoplastic immunotherapy: Secondary | ICD-10-CM | POA: Insufficient documentation

## 2021-09-06 DIAGNOSIS — Z5111 Encounter for antineoplastic chemotherapy: Secondary | ICD-10-CM | POA: Insufficient documentation

## 2021-09-06 DIAGNOSIS — Z7401 Bed confinement status: Secondary | ICD-10-CM

## 2021-09-06 DIAGNOSIS — Z87891 Personal history of nicotine dependence: Secondary | ICD-10-CM | POA: Insufficient documentation

## 2021-09-06 DIAGNOSIS — R479 Unspecified speech disturbances: Secondary | ICD-10-CM | POA: Insufficient documentation

## 2021-09-06 DIAGNOSIS — F1010 Alcohol abuse, uncomplicated: Secondary | ICD-10-CM | POA: Diagnosis present

## 2021-09-06 DIAGNOSIS — C770 Secondary and unspecified malignant neoplasm of lymph nodes of head, face and neck: Secondary | ICD-10-CM | POA: Insufficient documentation

## 2021-09-06 LAB — COMPREHENSIVE METABOLIC PANEL WITH GFR
ALT: 23 U/L (ref 0–44)
AST: 24 U/L (ref 15–41)
Albumin: 2.5 g/dL — ABNORMAL LOW (ref 3.5–5.0)
Alkaline Phosphatase: 68 U/L (ref 38–126)
Anion gap: 8 (ref 5–15)
BUN: 10 mg/dL (ref 6–20)
CO2: 24 mmol/L (ref 22–32)
Calcium: 7.9 mg/dL — ABNORMAL LOW (ref 8.9–10.3)
Chloride: 98 mmol/L (ref 98–111)
Creatinine, Ser: 0.74 mg/dL (ref 0.61–1.24)
GFR, Estimated: >60 mL/min
Glucose, Bld: 221 mg/dL — ABNORMAL HIGH (ref 70–99)
Potassium: 3.7 mmol/L (ref 3.5–5.1)
Sodium: 130 mmol/L — ABNORMAL LOW (ref 135–145)
Total Bilirubin: 0.8 mg/dL (ref 0.3–1.2)
Total Protein: 6.9 g/dL (ref 6.5–8.1)

## 2021-09-06 LAB — CBC
HCT: 35.8 % — ABNORMAL LOW (ref 39.0–52.0)
Hemoglobin: 11.8 g/dL — ABNORMAL LOW (ref 13.0–17.0)
MCH: 27.4 pg (ref 26.0–34.0)
MCHC: 33 g/dL (ref 30.0–36.0)
MCV: 83.3 fL (ref 80.0–100.0)
Platelets: 475 K/uL — ABNORMAL HIGH (ref 150–400)
RBC: 4.3 MIL/uL (ref 4.22–5.81)
RDW: 16 % — ABNORMAL HIGH (ref 11.5–15.5)
WBC: 16.2 K/uL — ABNORMAL HIGH (ref 4.0–10.5)
nRBC: 0 % (ref 0.0–0.2)

## 2021-09-06 LAB — PHOSPHORUS: Phosphorus: 3 mg/dL (ref 2.5–4.6)

## 2021-09-06 LAB — MAGNESIUM: Magnesium: 2.1 mg/dL (ref 1.7–2.4)

## 2021-09-06 MED ORDER — LORAZEPAM 2 MG/ML IJ SOLN: 1.0000 mg | INTRAMUSCULAR | Status: AC | PRN

## 2021-09-06 MED ORDER — SODIUM CHLORIDE 0.9 % IV SOLN: INTRAVENOUS | Status: DC

## 2021-09-06 MED ORDER — MORPHINE SULFATE (PF) 2 MG/ML IV SOLN
2.0000 mg | Freq: Once | INTRAVENOUS | Status: AC
  Administered 2021-09-06: 2 mg via INTRAVENOUS
  Filled 2021-09-06: qty 1

## 2021-09-06 MED ORDER — FOLIC ACID 1 MG PO TABS
1.0000 mg | ORAL_TABLET | Freq: Every day | ORAL | Status: DC
Start: 2021-09-06 — End: 2021-09-12
  Administered 2021-09-06 – 2021-09-12 (×7): 1 mg via ORAL
  Filled 2021-09-06 (×7): qty 1

## 2021-09-06 MED ORDER — DOCUSATE SODIUM 100 MG PO CAPS
100.0000 mg | ORAL_CAPSULE | Freq: Two times a day (BID) | ORAL | Status: DC
  Administered 2021-09-07 – 2021-09-12 (×11): 100 mg via ORAL
  Filled 2021-09-06 (×12): qty 1

## 2021-09-06 MED ORDER — SODIUM CHLORIDE 0.9 % IV SOLN
10.0000 mg | Freq: Once | INTRAVENOUS | Status: AC
  Administered 2021-09-06: 10 mg via INTRAVENOUS
  Filled 2021-09-06: qty 10

## 2021-09-06 MED ORDER — ACETAMINOPHEN 325 MG PO TABS
650.0000 mg | ORAL_TABLET | Freq: Four times a day (QID) | ORAL | Status: DC | PRN
  Administered 2021-09-12: 650 mg via ORAL
  Filled 2021-09-06: qty 2

## 2021-09-06 MED ORDER — ONDANSETRON HCL 4 MG/2ML IJ SOLN: 4.0000 mg | Freq: Four times a day (QID) | INTRAMUSCULAR | Status: DC | PRN

## 2021-09-06 MED ORDER — ACETAMINOPHEN 650 MG RE SUPP: 650.0000 mg | Freq: Four times a day (QID) | RECTAL | Status: DC | PRN

## 2021-09-06 MED ORDER — LORAZEPAM 1 MG PO TABS: 1.0000 mg | ORAL_TABLET | ORAL | Status: AC | PRN

## 2021-09-06 MED ORDER — LORAZEPAM 2 MG/ML IJ SOLN
2.0000 mg | Freq: Once | INTRAMUSCULAR | Status: DC | PRN
  Filled 2021-09-06: qty 1

## 2021-09-06 MED ORDER — HYDRALAZINE HCL 20 MG/ML IJ SOLN: 5.0000 mg | Freq: Four times a day (QID) | INTRAMUSCULAR | Status: DC | PRN

## 2021-09-06 MED ORDER — ONDANSETRON HCL 4 MG PO TABS: 4.0000 mg | ORAL_TABLET | Freq: Four times a day (QID) | ORAL | Status: DC | PRN

## 2021-09-06 MED ORDER — HEPARIN SODIUM (PORCINE) 5000 UNIT/ML IJ SOLN: 5000.0000 [IU] | Freq: Three times a day (TID) | INTRAMUSCULAR | Status: DC

## 2021-09-06 MED ORDER — DEXAMETHASONE SODIUM PHOSPHATE 4 MG/ML IJ SOLN
4.0000 mg | Freq: Four times a day (QID) | INTRAMUSCULAR | Status: DC
  Administered 2021-09-06 – 2021-09-10 (×15): 4 mg via INTRAVENOUS
  Filled 2021-09-06 (×15): qty 1

## 2021-09-06 MED ORDER — OXYCODONE HCL 5 MG PO TABS: 5.0000 mg | ORAL_TABLET | ORAL | Status: DC | PRN

## 2021-09-06 MED ORDER — ADULT MULTIVITAMIN W/MINERALS CH
1.0000 | ORAL_TABLET | Freq: Every day | ORAL | Status: DC
  Administered 2021-09-06 – 2021-09-12 (×7): 1 via ORAL
  Filled 2021-09-06 (×7): qty 1

## 2021-09-06 MED ORDER — SENNOSIDES-DOCUSATE SODIUM 8.6-50 MG PO TABS: 1.0000 | ORAL_TABLET | Freq: Every evening | ORAL | Status: DC | PRN

## 2021-09-06 MED ORDER — THIAMINE HCL 100 MG PO TABS
100.0000 mg | ORAL_TABLET | Freq: Every day | ORAL | Status: DC
  Administered 2021-09-06 – 2021-09-12 (×7): 100 mg via ORAL
  Filled 2021-09-06 (×7): qty 1

## 2021-09-06 MED ORDER — HEPARIN SODIUM (PORCINE) 5000 UNIT/ML IJ SOLN
5000.0000 [IU] | Freq: Three times a day (TID) | INTRAMUSCULAR | Status: DC
  Administered 2021-09-07 – 2021-09-12 (×15): 5000 [IU] via SUBCUTANEOUS
  Filled 2021-09-06 (×16): qty 1

## 2021-09-06 MED ORDER — THIAMINE HCL 100 MG/ML IJ SOLN: 100.0000 mg | Freq: Every day | INTRAMUSCULAR | Status: DC

## 2021-09-06 NOTE — Consult Note (Signed)
Radiation Oncology         [phone]) [phone] ________________________________  Name: Douglas Holt        MRN: [id number]  Date of Service: 09/06/2021 DOB: 09/19/1960  CC:Maximiano Coss, NP       REFERRING PHYSICIAN: Dr. Benay Spice   DIAGNOSIS: Lung mass with symptoms of neurologic deficits and probable bone and brain metastases.    HISTORY OF PRESENT ILLNESS: Douglas Holt is a 61 y.o. male seen at the request of Dr. 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  The patient has not been under routine medical care in the last 6 years, he recently presented with symptoms of dysphagia neck adenopathy, headaches confusion and imaging on 09/04/2021 documented in the epic chart shows concerns for a large tumor in the right lung with mediastinal subcarinal paratracheal adenopathy as well as cervical adenopathy in the neck and the visualized portion of the base of the brain concern for metastatic disease to the brain as well.  There was also concern for metastatic disease in the cervical and thoracic spine.  He was treated with steroids in the clinic today and medical oncology after meeting with Dr. Benay Spice.  We have been requested to urgently offer palliative radiotherapy for this patient.  He does not have any tissue confirmation of diagnosis nor does he have any additional imaging.  We request medical oncology to order MRIs of his cervical spine, as well as brain.  He has been accepted to the hospitalist service as a direct admit.  As the patient has been experiencing confusion, changes in speech headache and cognitive function, his brother Douglas Holt is contacted by phone to review the recommendations of treatment.  PREVIOUS RADIATION THERAPY: No   PAST MEDICAL HISTORY:  Past Medical History:  Diagnosis Date   Back pain    collasped disc       PAST SURGICAL HISTORY: Past Surgical History:  Procedure Laterality Date   INGUINAL HERNIA REPAIR  10/02/2011    Procedure: HERNIA REPAIR INGUINAL ADULT;  Surgeon: Jamesetta So, MD;  Location: [facility];  Service: General;  Laterality: Right;   LUMBAR DISC SURGERY       FAMILY HISTORY: No family history on file.   SOCIAL HISTORY:  reports that he has been smoking cigarettes. He has a 80.00 pack-year smoking history. He does not have any smokeless tobacco history on file. He reports current alcohol use of about 28.0 standard drinks per week. He reports that he does not use drugs. The patient is single and lives alone. His brother Douglas Holt is his Air traffic controller and has been encouraging him to be evaluated.    ALLERGIES: Patient has no known allergies.   MEDICATIONS:  Current Facility-Administered Medications  Medication Dose Route Frequency Provider Last Rate Last Admin   0.9 %  sodium chloride infusion   Intravenous Continuous Raiford Noble Holden, DO 100 mL/hr at 09/06/21 1815 New Bag at 09/06/21 1815   acetaminophen (TYLENOL) tablet 650 mg  650 mg Oral Q6H PRN Raiford Noble Latif, DO       Or   acetaminophen (TYLENOL) suppository 650 mg  650 mg Rectal Q6H PRN Sheikh, Omair Latif, DO       dexamethasone (DECADRON) injection 4 mg  4 mg Intravenous Q6H Sheikh, Omair Manito, DO   4 mg at 09/06/21 1813   docusate sodium (COLACE) capsule 100 mg  100 mg Oral BID Raiford Noble [place], DO  folic acid (FOLVITE) tablet 1 mg  1 mg Oral Daily Raiford Noble [place], DO   1 mg at 09/06/21 1813   [START ON 09/07/2021] heparin injection 5,000 Units  5,000 Units Subcutaneous Q8H Allred, Darrell K, PA-C       hydrALAZINE (APRESOLINE) injection 5 mg  5 mg Intravenous Q6H PRN Sheikh, Omair Latif, DO       LORazepam (ATIVAN) tablet 1-4 mg  1-4 mg Oral Q1H PRN Raiford Noble Latif, DO       Or   LORazepam (ATIVAN) injection 1-4 mg  1-4 mg Intravenous Q1H PRN Raiford Noble Latif, DO       multivitamin with minerals tablet 1 tablet  1 tablet Oral Daily Raiford Noble [place], DO   1 tablet at 09/06/21 1813   ondansetron  (ZOFRAN) tablet 4 mg  4 mg Oral Q6H PRN Raiford Noble Latif, DO       Or   ondansetron [facility]) injection 4 mg  4 mg Intravenous Q6H PRN Sheikh, Omair Latif, DO       oxyCODONE (Oxy IR/ROXICODONE) immediate release tablet 5 mg  5 mg Oral Q4H PRN Sheikh, Omair Latif, DO       senna-docusate (Senokot-S) tablet 1 tablet  1 tablet Oral QHS PRN Raiford Noble Latif, DO       thiamine tablet 100 mg  100 mg Oral Daily Raiford Noble Latif, DO   100 mg at 09/06/21 1813   Or   thiamine (B-1) injection 100 mg  100 mg Intravenous Daily Raiford Noble Latif, DO         REVIEW OF SYSTEMS: On review of systems, the patient's brother reports he has been loosing weight in the last few months, and really has not eaten any meals with normal sized portions in the last week. He's had a poor appetite, productive cough, and shortness of breath. He has also had headaches, confusion, changes in speech, and neck swelling in that time frame. No fevers or chills have been reported per his brother. No complaints of new pains are noted, no additional complaints are otherwise verbalized.      PHYSICAL EXAM:  Unable to assess as the patient is in transit to the hospital floor.    ECOG = 2  0 - Asymptomatic (Fully active, able to carry on all predisease activities without restriction)  1 - Symptomatic but completely ambulatory (Restricted in physically strenuous activity but ambulatory and able to carry out work of a light or sedentary nature. For example, light housework, office work)  2 - Symptomatic, <50% in bed during the day (Ambulatory and capable of all self care but unable to carry out any work activities. Up and about more than 50% of waking hours)  3 - Symptomatic, >50% in bed, but not bedbound (Capable of only limited self-care, confined to bed or chair 50% or more of waking hours)  4 - Bedbound (Completely disabled. Cannot carry on any self-care. Totally confined to bed or chair)  5 - Death   Eustace Pen MM,  Creech RH, Tormey DC, et al. [phone]). "Toxicity and response criteria of the [facility]". [place] Oncol. 5 (6): 649-55    LABORATORY DATA:  Lab Results  Component Value Date   WBC 16.2 (H) 09/06/2021   HGB 11.8 (L) 09/06/2021   HCT 35.8 (L) 09/06/2021   MCV 83.3 09/06/2021   PLT 475 (H) 09/06/2021   Lab Results  Component Value Date   NA 130 (L) 09/06/2021  K 3.7 09/06/2021   CL 98 09/06/2021   CO2 24 09/06/2021   Lab Results  Component Value Date   ALT 23 09/06/2021   AST 24 09/06/2021   ALKPHOS 68 09/06/2021   BILITOT 0.8 09/06/2021      RADIOGRAPHY: CT Soft Tissue Neck W Contrast  Result Date: 09/05/2021 CLINICAL DATA:  Provided history: Neck mass. Neck mass, nonpulsatile. Additional history provided by scanning technologist: Patient reports difficulty speaking, knots on right side of neck for 1-2 months. 120 pack-year current smoker. EXAM: CT NECK WITH CONTRAST TECHNIQUE: Multidetector CT imaging of the neck was performed using the standard protocol following the bolus administration of intravenous contrast. RADIATION DOSE REDUCTION: This exam was performed according to the departmental dose-optimization program which includes automated exposure control, adjustment of the mA and/or kV according to patient size and/or use of iterative reconstruction technique. CONTRAST:  65mL ISOVUE-300 IOPAMIDOL (ISOVUE-300) INJECTION 61% COMPARISON:  None Available. FINDINGS: Pharynx and larynx: No appreciable swelling or mass within the oral cavity, pharynx or larynx. Postinflammatory calcifications/tonsilloliths within the right palatine tonsil. Salivary glands: Atrophic appearance of the bilateral parotid glands. Unremarkable appearance of the submandibular glands. Thyroid: Unremarkable. Lymph nodes: 1.6 x 1.1 cm centrally cystic/necrotic and peripherally hyperenhancing right level 2/3 lymph node deep to the right-sided skin surface marker (series 2, image 62)  (series 12, image 22). Numerous enlarged lymph nodes within the right lower neck, right retroclavicular region, partially imaged right axilla and partially imaged mediastinum. For instance, an enlarged right retroclavicular lymph node measures 2.5 x 1.5 cm in transaxial dimensions (series 10, image 60). Some of these lymph nodes are hyperenhancing and centrally cystic/necrotic change. The partially imaged mediastinal lymphadenopathy is extensive. Vascular: The major vascular structures of the neck are patent. Atherosclerotic plaque within the visualized aortic arch, proximal major branch vessels of the neck and carotid arteries. Limited intracranial: 2.4 x 2.0 cm enhancing metastasis within the left temporal lobe with prominent surrounding edema (series 2, image 2). 2.6 x 2.5 cm peripherally enhancing metastasis within the medial right cerebellar hemisphere with surrounding edema (series 2, image 27). Visualized orbits: The orbits are largely excluded from the field of view. Mastoids and visualized paranasal sinuses: No significant paranasal sinus disease or mastoid effusion at the imaged levels. Skeleton: No acute bony abnormality or aggressive osseous lesion identified. C2-C3 grade 1 retrolisthesis. Cervical spondylosis with multilevel disc space narrowing, disc bulges/central disc protrusion, endplate spurring, uncovertebral hypertrophy and facet arthrosis. Disc space narrowing is greatest at C5-C6 and C6-C7 (advanced at these levels). Multilevel spinal canal stenosis. Most notably, a broad-based central disc protrusion contributes to at least moderate spinal canal stenosis at C5-C6. Multilevel bony neural foraminal narrowing. Upper chest: Please refer to the concurrently performed and separately reported chest CT for a description of intrathoracic findings, including but not limited to a large right upper lobe lung mass and extensive mediastinal lymphadenopathy. Other: 1.5 x 1.9 x 1.6 cm peripherally enhancing,  centrally cystic/necrotic lesion within the paraspinal musculature at midline and to the right at the C2-C3 level (series 2, image 52). 2.1 x 2.4 x 1.8 cm peripherally enhancing and centrally cystic/necrotic lesion within the right paraspinal musculature at the T2-T3 level (series 2, image 92). These lesions likely reflect tumor deposits. Impressions 1,2,3,4 will be called to the ordering clinician or representative by the Radiologist Assistant, and communication documented in the PACS or [employer]. IMPRESSION: 1. 1.6 x 1.1 cm hyperenhancing and centrally cystic/necrotic right level 2/3 lymph node located immediately deep to the  right-sided skin surface marker. This is consistent with nodal metastatic disease. Prominent metastatic lymphadenopathy is also present within the right lower neck, right retroclavicular region, partially imaged right axilla and partially imaged mediastinum. 2. Peripherally enhancing and centrally cystic/necrotic lesions in the right paraspinal musculature at C2-C3 and T2-T3, as described. These lesions likely reflect metastatic tumor deposits. 3. 2.4 x 2.0 cm enhancing intracranial metastasis within the left temporal lobe with prominent surrounding edema. 2.6 x 2.5 cm peripherally enhancing metastasis within the medial right cerebellar hemisphere with surrounding edema. A brain MRI without and with contrast is recommended for further evaluation. 4. Please refer to the concurrently performed and separately reported chest CT for a description of intrathoracic findings, including but not limited to a large right upper lobe lung mass. 5. Atherosclerotic plaque within the visualized aortic arch, proximal major branch vessels of the neck and carotid arteries. Aortic Atherosclerosis (ICD10-I70.0). 6. Cervical spondylosis, as described. Electronically Signed   By: Kellie Simmering D.O.   On: 09/05/2021 11:20   CT CHEST LUNG CA SCREEN LOW DOSE W/O CM  Result Date: 09/06/2021 CLINICAL DATA:   61 year old male current smoker with 120 pack-year history of smoking. Lung cancer screening examination. EXAM: CT CHEST WITHOUT CONTRAST LOW-DOSE FOR LUNG CANCER SCREENING TECHNIQUE: Multidetector CT imaging of the chest was performed following the standard protocol without IV contrast. RADIATION DOSE REDUCTION: This exam was performed according to the departmental dose-optimization program which includes automated exposure control, adjustment of the mA and/or kV according to patient size and/or use of iterative reconstruction technique. COMPARISON:  No priors. FINDINGS: Cardiovascular: Heart size is normal. There is no significant pericardial fluid, thickening or pericardial calcification. There is aortic atherosclerosis, as well as atherosclerosis of the great vessels of the mediastinum and the coronary arteries, including calcified atherosclerotic plaque in the left anterior descending and right coronary arteries. Mild calcifications of the mitral annulus. Mediastinum/Nodes: Bulky subcarinal lymphadenopathy measuring up to 2.2 cm in short axis. Fullness in the right hilar region poorly evaluated on today's noncontrast CT examination, but suspicious for right hilar lymphadenopathy. Bulky low right paratracheal lymph node measuring 2.2 cm in short axis. Other high right paratracheal lymphadenopathy measuring up to 1.8 cm in short axis in the superior mediastinum. Esophagus is unremarkable in appearance. No axillary lymphadenopathy. Lungs/Pleura: Large macrolobulated partially cavitary spiculated right upper lobe mass (axial image 70) with a volume derived mean diameter of 6.2 cm, highly concerning for primary bronchogenic carcinoma such as a squamous cell carcinoma. Several other smaller pulmonary nodules are also noted. No acute consolidative airspace disease. No pleural effusions. Mild diffuse bronchial wall thickening with mild centrilobular and paraseptal emphysema. Upper Abdomen: Aortic atherosclerosis. 2.2 x  1.2 cm intermediate attenuation (29 HU) right adrenal nodule. 3.1 x 2.4 cm intermediate attenuation (37 HU) left adrenal mass. Musculoskeletal: There are no aggressive appearing lytic or blastic lesions noted in the visualized portions of the skeleton. IMPRESSION: 1. 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 This is associated with probable right hilar and mediastinal lymphadenopathy, as well as bilateral adrenal nodules which may represent metastatic lesions. Additional imaging evaluation or consultation with Pulmonology or Thoracic Surgery recommended. 2. The "S" modifier above refers to potentially clinically significant non lung cancer related findings. Specifically, there is aortic atherosclerosis, in addition to two-vessel coronary artery disease. Please note that although the presence of coronary artery calcium documents the presence of  coronary artery disease, the severity of this disease and any potential stenosis cannot be assessed on this non-gated CT examination. Assessment for potential risk factor modification, dietary therapy or pharmacologic therapy may be warranted, if clinically indicated. 3. Mild diffuse bronchial wall thickening with mild centrilobular and paraseptal emphysema; imaging findings suggestive of underlying COPD. 4. There are calcifications of the mitral annulus. Echocardiographic correlation for evaluation of potential valvular dysfunction may be warranted if clinically indicated. These results will be called to the ordering clinician or representative by the Radiologist Assistant, and communication documented in the PACS or [employer]. Aortic Atherosclerosis (ICD10-I70.0) and Emphysema (ICD10-J43.9). Electronically Signed   By: Vinnie Langton M.D.   On: 09/06/2021 06:36       IMPRESSION/PLAN: 1. Probable Stage IV  lung cancer with metastases to the cervical nodal chains, probable spine, and brain metastases. Dr. Lisbeth Renshaw has reviewed the patient's imaging in the outpatient setting. Medical oncology has asked the hospitalist service to direct admit this patient on their behalf and he will be arriving to the floor via [place]. I spoke today with his brother Douglas Holt who is is older brother and surrogate decision maker as the patient's confusion has kept him from being able to consent. We've asked medical oncology to order STAT MRI of the brain and C spine.  If he indeed does have lung cancer, he also needs to have a PET scan in the outpatient setting.  He does not have tissue confirmation yet of his presumed malignancy, and we agree with the need to pursue this. Interventional radiology will be consulted to request a node biopsy in the cervical node chain. While ideally we would like to wait for the patient's pathology to confirm his histology and what is most likely a non small cell lung cancer, Dr. Lisbeth Renshaw is in agreement that his neurologic symptoms are quite concerning, and that we should consider at minimum simulation, with the expectation of having a diagnosis by early next week. We can temporize his symptoms from presumed brain disease with dexamethasone. We will taper this at the conclusion of radiotherapy as well. We will follow up with these MRI results, as well as tissue biopsies and anticipate at minimum a palliative course of radiotherapy to the cervical nodes, possibly as well to the cervical spine and brain.  We discussed the risks, benefits, short, and long term effects of radiotherapy, as well as the palliative intent, and the patient's brother is interested in proceeding.  I discussed the delivery and logistics of radiotherapy and Dr. Lisbeth Renshaw recommends a course of 2 weeks of radiotherapy to the sites above. Verbal consent from Douglas Holt was given. A copy will be provided tomorrow to the patient or his  brother Douglas Holt, and we will plan to simulate his treatment in the morning.     In a visit lasting 90 minutes, greater than 50% of the time was spent by phone and floor time discussing the patient's condition, in preparation for the discussion, and coordinating the patient's care.      Carola Rhine, [facility]   **Disclaimer: This note was dictated with voice recognition software. Similar sounding words can inadvertently be transcribed and this note may contain transcription errors which may not have been corrected upon publication of note.**

## 2021-09-06 NOTE — Progress Notes (Signed)
Patient is refusing MRI. Patient stated that he does not need it. He wants everything done tomorrow. That is what he was told. He is also refusing insertion of new IV. Previous IV leaking. Hospitalist on call, notified.

## 2021-09-06 NOTE — Patient Instructions (Signed)
Morphine Injection What is this medication? MORPHINE (MOR feen) treats severe pain. It is prescribed when other pain medications have not worked or cannot be tolerated. It works by blocking pain signals in the brain. It belongs to a group of medications called opioids. This medicine may be used for other purposes; ask your health care provider or pharmacist if you have questions. COMMON BRAND NAME(S): Astramorph PF, Duramorph, Duramorph PF, Infumorph, MITIGO What should I tell my care team before I take this medication? They need to know if you have any of these conditions: Bleeding disorder Brain tumor Drug abuse or addiction Head injury Heart disease If you often drink alcohol Low adrenal gland function Lung disease, asthma, or breathing problem Seizures Stomach or intestine problems Take medications that treat or prevent blood clots Taken an MAOI such as Marplan, Nardil, or Parnate in the last 14 days Trouble passing urine An unusual or allergic reaction to morphine, other medications, foods, dyes, or preservatives Pregnant or trying to get pregnant Breast-feeding How should I use this medication? This medication is for injection into a muscle, vein, or under the skin. It is usually given in a hospital or clinic setting. If you get this medication at home, you will be taught how to prepare and give this medication. Use exactly as directed. Take your medication at regular intervals. Do not take your medication more often than directed. Always look at your medication before using it. Do not use the injection if its color is darker than pale yellow or if it is discolored in any other way. Do not use this medication if it is cloudy, thickened, colored, or has solid particles in it. It is important that you put your used needles and syringes in a special sharps container. Do not put them in a trash can. If you do not have a sharps container, call your pharmacist or care team to get  one. Talk to your care team regarding the use of this medication in children. Special care may be needed. Overdosage: If you think you have taken too much of this medicine contact a poison control center or emergency room at once. NOTE: This medicine is only for you. Do not share this medicine with others. What if I miss a dose? If you miss a dose, take it as soon as you can. If it is almost time for your next dose, take only that dose. Do not take double or extra doses. What may interact with this medication? Do not take this medication with any of the following: Linezolid MAOIs like Marplan, Nardil, and Parnate Methylene blue Samidorphan This medication may interact with the following: Alcohol Antihistamines for allergy, cough, and cold Atropine Certain medications for anxiety or sleep Certain medications for bladder problems like oxybutynin, tolterodine Certain medications for depression like amitriptyline, fluoxetine, sertraline, mirtazapine, trazodone Certain medications for migraine headache like almotriptan, eletriptan, frovatriptan, naratriptan, rizatriptan, sumatriptan, zolmitriptan Certain medications for nausea or vomiting like dolasetron, granisetron, ondansetron, palonosetron Certain medications for Parkinson's disease like benztropine, trihexyphenidyl Certain medications for seizures like phenobarbital, primidone Certain medications for stomach problems like dicyclomine, hyoscyamine Certain medications for travel sickness like scopolamine Clopidogrel Diuretics General anesthetics like halothane, isoflurane, methoxyflurane, propofol Ipratropium Medications that relax muscles Other narcotic medications for pain or cough Phenothiazines like chlorpromazine, mesoridazine, prochlorperazine, thioridazine Prasugrel Ticagrelor This list may not describe all possible interactions. Give your health care provider a list of all the medicines, herbs, non-prescription drugs, or  dietary supplements you use. Also tell them if  you smoke, drink alcohol, or use illegal drugs. Some items may interact with your medicine. What should I watch for while using this medication? Tell your care team if your pain does not go away, if it gets worse, or if you have new or a different type of pain. You may develop tolerance to this medication. Tolerance means that you will need a higher dose of the medication for pain relief. Tolerance is normal and is expected if you take this medication for a long time. There are different types of narcotic medications (opioids) for pain. If you take more than one type at the same time, you may have more side effects. Give your care team a list of all medications you use. He or she will tell you how much medication to take. Do not take more medication than directed. Get emergency help right away if you have problems breathing. Do not suddenly stop taking your medication because you may develop a severe reaction. Your body becomes used to the medication. This does NOT mean you are addicted. Addiction is a behavior related to getting and using a medication for a nonmedical reason. If you have pain, you have a medical reason to take pain medication. Your care team will tell you how much medication to take. If your care team wants you to stop the medication, the dose will be slowly lowered over time to avoid any side effects. Talk to your care team about naloxone and how to get it. Naloxone is an emergency medication used for an opioid overdose. An overdose can happen if you take too much opioid. It can also happen if an opioid is taken with some other medications or substances, like alcohol. Know the symptoms of an overdose, like trouble breathing, unusually tired or sleepy, or not being able to respond or wake up. Make sure to tell caregivers and close contacts where it is stored. Make sure they know how to use it. After naloxone is given, you must get emergency help  right away. Naloxone is a temporary treatment. Repeat doses may be needed. You may get drowsy or dizzy. Do not drive, use machinery, or do anything that needs mental alertness until you know how this medication affects you. Do not stand up or sit up quickly, especially if you are an older patient. This reduces the risk of dizzy or fainting spells. Alcohol may interfere with the effect of this medication. Avoid alcoholic drinks. This medication will cause constipation. If you do not have a bowel movement for 3 days, call your care team. Your mouth may get dry. Chewing sugarless gum or sucking hard candy and drinking plenty of water may help. Contact your care team if the problem does not go away or is severe. What side effects may I notice from receiving this medication? Side effects that you should report to your care team as soon as possible: Allergic reactions--skin rash, itching, hives, swelling of the face, lips, tongue, or throat CNS depression--slow or shallow breathing, shortness of breath, feeling faint, dizziness, confusion, difficulty staying awake Low adrenal gland function--nausea, vomiting, loss of appetite, unusual weakness or fatigue, dizziness Low blood pressure--dizziness, feeling faint or lightheaded, blurry vision Side effects that usually do not require medical attention (report to your care team if they continue or are bothersome): Constipation Dizziness Drowsiness Dry mouth Headache Nausea Vomiting This list may not describe all possible side effects. Call your doctor for medical advice about side effects. You may report side effects to FDA at 1-800-FDA-1088.  Where should I keep my medication? Keep out of the reach of children and pets. This medication can be abused. Keep it in a safe place to protect it from theft. Do not share this medication with anyone. Selling or giving away this medication is dangerous and is against the law. If you are using this medication at home,  you will be instructed on how to store this medication. Throw away any unused medication after the expiration date on the label. Discard unused medication and used packaging carefully. Children and pets can be harmed if they find used or lost packages. NOTE: This sheet is a summary. It may not cover all possible information. If you have questions about this medicine, talk to your doctor, pharmacist, or health care provider.  2023 [employer] Standard (2020-05-05 00:00:00)

## 2021-09-06 NOTE — Telephone Encounter (Signed)
Called [facility] [place] and spoke with Mettler and give report.

## 2021-09-06 NOTE — Progress Notes (Signed)
PATIENT NAVIGATOR PROGRESS NOTE  Name: Douglas Holt Date: 09/06/2021 MRN: [id number]  DOB: Oct 24, 1960   Reason for visit:  Introductory phone call  Comments:  Called patient and left message for call back for appt today at 10:15 Called contact brother Sayan Aldava and he will be bringing him in today at 10:15 for workin appt. Directions and parking instructions given and contact number to call with any questions    Time spent counseling/coordinating care: 30-45 minutes

## 2021-09-06 NOTE — Telephone Encounter (Addendum)
Review chart 

## 2021-09-06 NOTE — Progress Notes (Unsigned)
Dr. Benay Spice discussed case with Dr. Tennis Must and he has accepted admission to [facility]. Bed control notified and will call office when bed [facility] [place] is ready.

## 2021-09-06 NOTE — Consult Note (Signed)
Chief Complaint: Patient was seen in consultation today for ultrasound-guided cervical lymph node biopsy   Referring Physician(s): Sheikh,O/Sherrill,B  Supervising Physician: Markus Daft  Patient Status: [facility] - In-pt  History of Present Illness: Douglas Holt is a 61 y.o. male long term smoker with PMH sig for RA who was admitted to [facility] today with history of neck mass present for 1 to 2 months along with difficulty speaking, weight loss, head "pressure" . CT of the neck on 5/30 revealed 1. 1.6 x 1.1 cm hyperenhancing and centrally cystic/necrotic right level 2/3 lymph node located immediately deep to the right-sided skin surface marker. This is consistent with nodal metastatic disease. Prominent metastatic lymphadenopathy is also present within the right lower neck, right retroclavicular region, partially imaged right axilla and partially imaged mediastinum. 2. Peripherally enhancing and centrally cystic/necrotic lesions in the right paraspinal musculature at C2-C3 and T2-T3, as described. These lesions likely reflect metastatic tumor deposits. 3. 2.4 x 2.0 cm enhancing intracranial metastasis within the left temporal lobe with prominent surrounding edema. 2.6 x 2.5 cm peripherally enhancing metastasis within the medial right cerebellar hemisphere with surrounding edema. A brain MRI without and with contrast is recommended for further evaluation. 4. Please refer to the concurrently performed and separately reported chest CT for a description of intrathoracic findings, including but not limited to a large right upper lobe lung mass. 5. Atherosclerotic plaque within the visualized aortic arch, proximal major branch vessels of the neck and carotid arteries. Aortic Atherosclerosis (ICD10-I70.0). 6. Cervical spondylosis,   CT of the chest revealed:  1. Highly aggressive appearing partially cavitary right upper lobe mass which almost certainly represents a  primary squamous cell carcinoma of the lung, categorized as Lung-RADS 4XS, highly suspicious. This is associated with probable right hilar and mediastinal lymphadenopathy, as well as bilateral adrenal nodules which may represent metastatic lesions. Additional imaging evaluation or consultation with Pulmonology or Thoracic Surgery recommended. 2. The "S" modifier above refers to potentially clinically significant non lung cancer related findings. Specifically, there is aortic atherosclerosis, in addition to two-vessel coronary artery disease. Please note that although the presence of coronary artery calcium documents the presence of coronary artery disease, the severity of this disease and any potential stenosis cannot be assessed on this non-gated CT examination. Assessment for potential risk factor modification, dietary therapy or pharmacologic therapy may be warranted, if clinically indicated. 3. Mild diffuse bronchial wall thickening with mild centrilobular and paraseptal emphysema; imaging findings suggestive of underlying COPD. 4. There are calcifications of the mitral annulus. Echocardiographic correlation for evaluation of potential valvular dysfunction may be warranted if clinically indicated.  CT head pending   Request received for image guided biopsy for further evaluation. Past Medical History:  Diagnosis Date   Back pain    collasped disc    Past Surgical History:  Procedure Laterality Date   INGUINAL HERNIA REPAIR  10/02/2011   Procedure: HERNIA REPAIR INGUINAL ADULT;  Surgeon: Jamesetta So, MD;  Location: [facility];  Service: General;  Laterality: Right;   LUMBAR DISC SURGERY      Allergies: Patient has no known allergies.  Medications: Prior to Admission medications   Medication Sig Start Date End Date Taking? Authorizing Provider  aspirin 325 MG tablet Take 325 mg by mouth every 6 (six) hours as needed. For pain    [provider]  doxycycline  (VIBRA-TABS) 100 MG tablet Take 1 tablet (100 mg total) by mouth 2 (two) times daily.  08/15/21   Maximiano Coss, NP  predniSONE (STERAPRED UNI-PAK 21 TAB) 10 MG (21) TBPK tablet Take per package instructions. Do not skip doses. Finish entire supply. 08/15/21   Maximiano Coss, NP  Tetrahydrozoline HCl (VISINE OP) Apply 1-2 drops to eye as needed. For red eyes    [provider]     No family history on file.  Social History   Socioeconomic History   Marital status: Single    Spouse name: Not on file   Number of children: Not on file   Years of education: Not on file   Highest education level: Not on file  Occupational History    Comment: disability for back pain  Tobacco Use   Smoking status: Every Day    Packs/day: 2.00    Years: 40.00    Pack years: 80.00    Types: Cigarettes   Smokeless tobacco: Not on file  Vaping Use   Vaping Use: Never used  Substance and Sexual Activity   Alcohol use: Yes    Alcohol/week: 28.0 standard drinks    Types: 28 Cans of beer per week    Comment: 3-4 beers a day    Drug use: No   Sexual activity: Not Currently  Other Topics Concern   Not on file  Social History Narrative   Not on file   Social Determinants of Health   Financial Resource Strain: Not on file  Food Insecurity: Not on file  Transportation Needs: Not on file  Physical Activity: Not on file  Stress: Not on file  Social Connections: Not on file     Review of Systems currently denies fever, nausea, vomiting or bleeding.  Has weight loss, speech impairment, head "pressure"  Vital Signs: BP (!) 146/89 (BP Location: Right Arm)   Pulse 80   Temp 100 F (37.8 C) (Oral)   Resp 14   SpO2 100%   Physical Exam :cachectic appearing white male , slurred speech noted; following commands okay.  Chest with distant breath sounds bilaterally.  Heart with regular rate and rhythm.  Abdomen soft, positive bowel sounds, nontender. No LE edema.  Imaging: CT Soft Tissue Neck W  Contrast  Result Date: 09/05/2021 CLINICAL DATA:  Provided history: Neck mass. Neck mass, nonpulsatile. Additional history provided by scanning technologist: Patient reports difficulty speaking, knots on right side of neck for 1-2 months. 120 pack-year current smoker. EXAM: CT NECK WITH CONTRAST TECHNIQUE: Multidetector CT imaging of the neck was performed using the standard protocol following the bolus administration of intravenous contrast. RADIATION DOSE REDUCTION: This exam was performed according to the departmental dose-optimization program which includes automated exposure control, adjustment of the mA and/or kV according to patient size and/or use of iterative reconstruction technique. CONTRAST:  22mL ISOVUE-300 IOPAMIDOL (ISOVUE-300) INJECTION 61% COMPARISON:  None Available. FINDINGS: Pharynx and larynx: No appreciable swelling or mass within the oral cavity, pharynx or larynx. Postinflammatory calcifications/tonsilloliths within the right palatine tonsil. Salivary glands: Atrophic appearance of the bilateral parotid glands. Unremarkable appearance of the submandibular glands. Thyroid: Unremarkable. Lymph nodes: 1.6 x 1.1 cm centrally cystic/necrotic and peripherally hyperenhancing right level 2/3 lymph node deep to the right-sided skin surface marker (series 2, image 62) (series 12, image 22). Numerous enlarged lymph nodes within the right lower neck, right retroclavicular region, partially imaged right axilla and partially imaged mediastinum. For instance, an enlarged right retroclavicular lymph node measures 2.5 x 1.5 cm in transaxial dimensions (series 10, image 60). Some of these lymph nodes are hyperenhancing and  centrally cystic/necrotic change. The partially imaged mediastinal lymphadenopathy is extensive. Vascular: The major vascular structures of the neck are patent. Atherosclerotic plaque within the visualized aortic arch, proximal major branch vessels of the neck and carotid arteries. Limited  intracranial: 2.4 x 2.0 cm enhancing metastasis within the left temporal lobe with prominent surrounding edema (series 2, image 2). 2.6 x 2.5 cm peripherally enhancing metastasis within the medial right cerebellar hemisphere with surrounding edema (series 2, image 27). Visualized orbits: The orbits are largely excluded from the field of view. Mastoids and visualized paranasal sinuses: No significant paranasal sinus disease or mastoid effusion at the imaged levels. Skeleton: No acute bony abnormality or aggressive osseous lesion identified. C2-C3 grade 1 retrolisthesis. Cervical spondylosis with multilevel disc space narrowing, disc bulges/central disc protrusion, endplate spurring, uncovertebral hypertrophy and facet arthrosis. Disc space narrowing is greatest at C5-C6 and C6-C7 (advanced at these levels). Multilevel spinal canal stenosis. Most notably, a broad-based central disc protrusion contributes to at least moderate spinal canal stenosis at C5-C6. Multilevel bony neural foraminal narrowing. Upper chest: Please refer to the concurrently performed and separately reported chest CT for a description of intrathoracic findings, including but not limited to a large right upper lobe lung mass and extensive mediastinal lymphadenopathy. Other: 1.5 x 1.9 x 1.6 cm peripherally enhancing, centrally cystic/necrotic lesion within the paraspinal musculature at midline and to the right at the C2-C3 level (series 2, image 52). 2.1 x 2.4 x 1.8 cm peripherally enhancing and centrally cystic/necrotic lesion within the right paraspinal musculature at the T2-T3 level (series 2, image 92). These lesions likely reflect tumor deposits. Impressions 1,2,3,4 will be called to the ordering clinician or representative by the Radiologist Assistant, and communication documented in the PACS or [employer]. IMPRESSION: 1. 1.6 x 1.1 cm hyperenhancing and centrally cystic/necrotic right level 2/3 lymph node located immediately deep to the  right-sided skin surface marker. This is consistent with nodal metastatic disease. Prominent metastatic lymphadenopathy is also present within the right lower neck, right retroclavicular region, partially imaged right axilla and partially imaged mediastinum. 2. Peripherally enhancing and centrally cystic/necrotic lesions in the right paraspinal musculature at C2-C3 and T2-T3, as described. These lesions likely reflect metastatic tumor deposits. 3. 2.4 x 2.0 cm enhancing intracranial metastasis within the left temporal lobe with prominent surrounding edema. 2.6 x 2.5 cm peripherally enhancing metastasis within the medial right cerebellar hemisphere with surrounding edema. A brain MRI without and with contrast is recommended for further evaluation. 4. Please refer to the concurrently performed and separately reported chest CT for a description of intrathoracic findings, including but not limited to a large right upper lobe lung mass. 5. Atherosclerotic plaque within the visualized aortic arch, proximal major branch vessels of the neck and carotid arteries. Aortic Atherosclerosis (ICD10-I70.0). 6. Cervical spondylosis, as described. Electronically Signed   By: Kellie Simmering D.O.   On: 09/05/2021 11:20   CT CHEST LUNG CA SCREEN LOW DOSE W/O CM  Result Date: 09/06/2021 CLINICAL DATA:  61 year old male current smoker with 120 pack-year history of smoking. Lung cancer screening examination. EXAM: CT CHEST WITHOUT CONTRAST LOW-DOSE FOR LUNG CANCER SCREENING TECHNIQUE: Multidetector CT imaging of the chest was performed following the standard protocol without IV contrast. RADIATION DOSE REDUCTION: This exam was performed according to the departmental dose-optimization program which includes automated exposure control, adjustment of the mA and/or kV according to patient size and/or use of iterative reconstruction technique. COMPARISON:  No priors. FINDINGS: Cardiovascular: Heart size is normal. There is no significant  pericardial fluid, thickening or pericardial calcification. There is aortic atherosclerosis, as well as atherosclerosis of the great vessels of the mediastinum and the coronary arteries, including calcified atherosclerotic plaque in the left anterior descending and right coronary arteries. Mild calcifications of the mitral annulus. Mediastinum/Nodes: Bulky subcarinal lymphadenopathy measuring up to 2.2 cm in short axis. Fullness in the right hilar region poorly evaluated on today's noncontrast CT examination, but suspicious for right hilar lymphadenopathy. Bulky low right paratracheal lymph node measuring 2.2 cm in short axis. Other high right paratracheal lymphadenopathy measuring up to 1.8 cm in short axis in the superior mediastinum. Esophagus is unremarkable in appearance. No axillary lymphadenopathy. Lungs/Pleura: Large macrolobulated partially cavitary spiculated right upper lobe mass (axial image 70) with a volume derived mean diameter of 6.2 cm, highly concerning for primary bronchogenic carcinoma such as a squamous cell carcinoma. Several other smaller pulmonary nodules are also noted. No acute consolidative airspace disease. No pleural effusions. Mild diffuse bronchial wall thickening with mild centrilobular and paraseptal emphysema. Upper Abdomen: Aortic atherosclerosis. 2.2 x 1.2 cm intermediate attenuation (29 HU) right adrenal nodule. 3.1 x 2.4 cm intermediate attenuation (37 HU) left adrenal mass. Musculoskeletal: There are no aggressive appearing lytic or blastic lesions noted in the visualized portions of the skeleton. IMPRESSION: 1. 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 This is associated with probable right hilar and mediastinal lymphadenopathy, as well as bilateral adrenal nodules which may represent metastatic lesions. Additional imaging  evaluation or consultation with Pulmonology or Thoracic Surgery recommended. 2. The "S" modifier above refers to potentially clinically significant non lung cancer related findings. Specifically, there is aortic atherosclerosis, in addition to two-vessel coronary artery disease. Please note that although the presence of coronary artery calcium documents the presence of coronary artery disease, the severity of this disease and any potential stenosis cannot be assessed on this non-gated CT examination. Assessment for potential risk factor modification, dietary therapy or pharmacologic therapy may be warranted, if clinically indicated. 3. Mild diffuse bronchial wall thickening with mild centrilobular and paraseptal emphysema; imaging findings suggestive of underlying COPD. 4. There are calcifications of the mitral annulus. Echocardiographic correlation for evaluation of potential valvular dysfunction may be warranted if clinically indicated. These results will be called to the ordering clinician or representative by the Radiologist Assistant, and communication documented in the PACS or [employer]. Aortic Atherosclerosis (ICD10-I70.0) and Emphysema (ICD10-J43.9). Electronically Signed   By: Vinnie Langton M.D.   On: 09/06/2021 06:36    Labs:  CBC: Recent Labs    08/15/21 1419  WBC 11.5*  HGB 13.3  HCT 40.6  PLT 488.0*    COAGS: No results for input(s): INR, APTT in the last 8760 hours.  BMP: Recent Labs    08/15/21 1419  NA 128*  K 4.8  CL 94*  CO2 27  GLUCOSE 91  BUN 9  CALCIUM 8.4  CREATININE 0.66    LIVER FUNCTION TESTS: Recent Labs    08/15/21 1419  BILITOT 0.4  AST 26  ALT 16  ALKPHOS 99  PROT 7.6  ALBUMIN 2.9*    TUMOR MARKERS: No results for input(s): AFPTM, CEA, CA199, CHROMGRNA in the last 8760 hours.  Assessment and Plan: 61 y.o. male long term smoker with PMH sig for RA who was admitted to [facility] today with history of neck mass present for 1  to 2 months along with  difficulty speaking, weight loss, head "pressure" . CT of the neck on 5/30 revealed 1. 1.6 x 1.1 cm hyperenhancing and centrally cystic/necrotic right level 2/3 lymph node located immediately deep to the right-sided skin surface marker. This is consistent with nodal metastatic disease. Prominent metastatic lymphadenopathy is also present within the right lower neck, right retroclavicular region, partially imaged right axilla and partially imaged mediastinum. 2. Peripherally enhancing and centrally cystic/necrotic lesions in the right paraspinal musculature at C2-C3 and T2-T3, as described. These lesions likely reflect metastatic tumor deposits. 3. 2.4 x 2.0 cm enhancing intracranial metastasis within the left temporal lobe with prominent surrounding edema. 2.6 x 2.5 cm peripherally enhancing metastasis within the medial right cerebellar hemisphere with surrounding edema. A brain MRI without and with contrast is recommended for further evaluation. 4. Please refer to the concurrently performed and separately reported chest CT for a description of intrathoracic findings, including but not limited to a large right upper lobe lung mass. 5. Atherosclerotic plaque within the visualized aortic arch, proximal major branch vessels of the neck and carotid arteries. Aortic Atherosclerosis (ICD10-I70.0). 6. Cervical spondylosis,   CT of the chest revealed:  1. 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 This is associated with probable right hilar and mediastinal lymphadenopathy, as well as bilateral adrenal nodules which may represent metastatic lesions. Additional imaging evaluation or consultation with Pulmonology or Thoracic Surgery recommended. 2. The "S" modifier above refers to potentially clinically significant  non lung cancer related findings. Specifically, there is aortic atherosclerosis, in addition to two-vessel coronary artery disease. Please note that although the presence of coronary artery calcium documents the presence of coronary artery disease, the severity of this disease and any potential stenosis cannot be assessed on this non-gated CT examination. Assessment for potential risk factor modification, dietary therapy or pharmacologic therapy may be warranted, if clinically indicated. 3. Mild diffuse bronchial wall thickening with mild centrilobular and paraseptal emphysema; imaging findings suggestive of underlying COPD. 4. There are calcifications of the mitral annulus. Echocardiographic correlation for evaluation of potential valvular dysfunction may be warranted if clinically indicated.  CT head pending   Request received for image guided biopsy for further ; imaging studies were reviewed by Dr. Anselm Pancoast . Plan is for US guided cervical LN bx on 6/2. Risks and benefits of procedure was discussed with the patient /brother including, but not limited to bleeding, infection, damage to adjacent structures or low yield requiring additional tests.  All of the questions were answered and there is agreement to proceed.  Consent signed and in chart.    Thank you for this interesting consult.  I greatly enjoyed meeting Douglas Holt and look forward to participating in their care.  A copy of this report was sent to the requesting provider on this date.  Electronically Signed: D. Rowe Robert, PA-C 09/06/2021, 5:20 PM   I spent a total of  25 minutes   in face to face in clinical consultation, greater than 50% of which was counseling/coordinating care for ultrasound-guided cervical lymph node biopsy

## 2021-09-06 NOTE — Progress Notes (Signed)
New Hematology/Oncology Consult   Requesting MD: Maximiano Coss, NP  [phone]      Reason for Consult: Lung mass, neck adenopathy, brain metastases  HPI: Douglas Holt is a 61 year old man referred for evaluation of recent findings of a lung mass, neck adenopathy and brain metastases.  It was recommended to him to proceed to the emergency department yesterday.  He declined but was agreeable to coming into our office today.  He had a chest CT and neck CT yesterday with findings of a cavitary right upper lobe mass, probable right hilar mediastinal lymphadenopathy, bilateral adrenal nodules, right neck adenopathy, necrotic lesions in the right paraspinal musculature, peripherally enhancing metastasis within the medial right cerebellar hemisphere with surrounding edema.  Douglas Holt is accompanied to today's appointment by his brother who provides most of the information.  Douglas Holt himself is unable to significantly participate.  His speech has been garbled and nearly unintelligible for 2 weeks.  2 days ago his brother noticed he was having trouble using his right hand.  He is not aware of any falls.  Appetite is poor.  He is losing weight.  Douglas Holt is having headaches, visual disturbance, shortness of breath and cough.   Past Medical History:  Diagnosis Date   Back pain    collasped disc  Rheumatoid arthritis, Raynaud's   Past Surgical History:  Procedure Laterality Date   INGUINAL HERNIA REPAIR  10/02/2011   Procedure: HERNIA REPAIR INGUINAL ADULT;  Surgeon: Jamesetta So, MD;  Location: [facility];  Service: General;  Laterality: Right;   LUMBAR DISC SURGERY       Current Outpatient Medications:    aspirin 325 MG tablet, Take 325 mg by mouth every 6 (six) hours as needed. For pain, Disp: , Rfl:    doxycycline (VIBRA-TABS) 100 MG tablet, Take 1 tablet (100 mg total) by mouth 2 (two) times daily., Disp: 20 tablet, Rfl: 0   predniSONE (STERAPRED UNI-PAK 21 TAB) 10 MG (21) TBPK tablet,  Take per package instructions. Do not skip doses. Finish entire supply., Disp: 1 each, Rfl: 0   Tetrahydrozoline HCl (VISINE OP), Apply 1-2 drops to eye as needed. For red eyes, Disp: , Rfl:  No current facility-administered medications for this visit.  Facility-Administered Medications Ordered in Other Visits:    0.9 %  sodium chloride infusion, , Intravenous, Continuous, Owens Shark, NP, Last Rate: 500 mL/hr at 09/06/21 1136, New Bag at 09/06/21 1136:   No Known Allergies:  FH: No family history of cancer.  SOCIAL HISTORY: He lives alone in [place].  He is not married.  No children.  He has 2 brothers who live 40 minutes away.  He smokes 2 packs of cigarette a day.  He drinks alcohol, unable to quantify the amount or type.  Review of Systems: Brother provides majority of the review of systems.  He reports a 2-week history of speech difficulty.  He has had trouble using the right hand/arm for the past 2 days.  He is not aware of any falls.  He notes appetite is poor and Douglas Holt has lost weight.  Douglas Holt clutches his head periodically.  He complains of shortness of breath and a cough.  Douglas Holt brother reports he has had no routine medical care in about 6 years.  Physical Exam:  Blood pressure 112/88, pulse 80, temperature 97.8 F (36.6 C), temperature source Oral, resp. rate 18, height 5' 9.5" (1.765 m), weight 113 lb (51.3 kg), SpO2 95 %.  Disheveled appearing male.  He is intermittently compliant with the exam. HEENT: No thrush. Lungs: Breath sounds diminished right lung field.  No respiratory distress. Cardiac: Regular rate and rhythm. Abdomen: Abdomen soft.  No hepatomegaly. Vascular: No leg edema.   Lymph nodes: Palpable right neck/supraclavicular lymph nodes. Neurologic: He is alert, speech is unintelligible/garbled.  Expressive aphasia.  He is confused.  Follows some commands.  Right arm seems weak.  LABS:  No results for input(s): WBC, HGB, HCT,  PLT in the last 72 hours.  No results for input(s): NA, K, CL, CO2, GLUCOSE, BUN, CREATININE, CALCIUM in the last 72 hours.    RADIOLOGY:  CT Soft Tissue Neck W Contrast  Result Date: 09/05/2021 CLINICAL DATA:  Provided history: Neck mass. Neck mass, nonpulsatile. Additional history provided by scanning technologist: Patient reports difficulty speaking, knots on right side of neck for 1-2 months. 120 pack-year current smoker. EXAM: CT NECK WITH CONTRAST TECHNIQUE: Multidetector CT imaging of the neck was performed using the standard protocol following the bolus administration of intravenous contrast. RADIATION DOSE REDUCTION: This exam was performed according to the departmental dose-optimization program which includes automated exposure control, adjustment of the mA and/or kV according to patient size and/or use of iterative reconstruction technique. CONTRAST:  35mL ISOVUE-300 IOPAMIDOL (ISOVUE-300) INJECTION 61% COMPARISON:  None Available. FINDINGS: Pharynx and larynx: No appreciable swelling or mass within the oral cavity, pharynx or larynx. Postinflammatory calcifications/tonsilloliths within the right palatine tonsil. Salivary glands: Atrophic appearance of the bilateral parotid glands. Unremarkable appearance of the submandibular glands. Thyroid: Unremarkable. Lymph nodes: 1.6 x 1.1 cm centrally cystic/necrotic and peripherally hyperenhancing right level 2/3 lymph node deep to the right-sided skin surface marker (series 2, image 62) (series 12, image 22). Numerous enlarged lymph nodes within the right lower neck, right retroclavicular region, partially imaged right axilla and partially imaged mediastinum. For instance, an enlarged right retroclavicular lymph node measures 2.5 x 1.5 cm in transaxial dimensions (series 10, image 60). Some of these lymph nodes are hyperenhancing and centrally cystic/necrotic change. The partially imaged mediastinal lymphadenopathy is extensive. Vascular: The major  vascular structures of the neck are patent. Atherosclerotic plaque within the visualized aortic arch, proximal major branch vessels of the neck and carotid arteries. Limited intracranial: 2.4 x 2.0 cm enhancing metastasis within the left temporal lobe with prominent surrounding edema (series 2, image 2). 2.6 x 2.5 cm peripherally enhancing metastasis within the medial right cerebellar hemisphere with surrounding edema (series 2, image 27). Visualized orbits: The orbits are largely excluded from the field of view. Mastoids and visualized paranasal sinuses: No significant paranasal sinus disease or mastoid effusion at the imaged levels. Skeleton: No acute bony abnormality or aggressive osseous lesion identified. C2-C3 grade 1 retrolisthesis. Cervical spondylosis with multilevel disc space narrowing, disc bulges/central disc protrusion, endplate spurring, uncovertebral hypertrophy and facet arthrosis. Disc space narrowing is greatest at C5-C6 and C6-C7 (advanced at these levels). Multilevel spinal canal stenosis. Most notably, a broad-based central disc protrusion contributes to at least moderate spinal canal stenosis at C5-C6. Multilevel bony neural foraminal narrowing. Upper chest: Please refer to the concurrently performed and separately reported chest CT for a description of intrathoracic findings, including but not limited to a large right upper lobe lung mass and extensive mediastinal lymphadenopathy. Other: 1.5 x 1.9 x 1.6 cm peripherally enhancing, centrally cystic/necrotic lesion within the paraspinal musculature at midline and to the right at the C2-C3 level (series 2, image 52). 2.1 x 2.4 x 1.8 cm peripherally enhancing and centrally cystic/necrotic  lesion within the right paraspinal musculature at the T2-T3 level (series 2, image 92). These lesions likely reflect tumor deposits. Impressions 1,2,3,4 will be called to the ordering clinician or representative by the Radiologist Assistant, and communication  documented in the PACS or [employer]. IMPRESSION: 1. 1.6 x 1.1 cm hyperenhancing and centrally cystic/necrotic right level 2/3 lymph node located immediately deep to the right-sided skin surface marker. This is consistent with nodal metastatic disease. Prominent metastatic lymphadenopathy is also present within the right lower neck, right retroclavicular region, partially imaged right axilla and partially imaged mediastinum. 2. Peripherally enhancing and centrally cystic/necrotic lesions in the right paraspinal musculature at C2-C3 and T2-T3, as described. These lesions likely reflect metastatic tumor deposits. 3. 2.4 x 2.0 cm enhancing intracranial metastasis within the left temporal lobe with prominent surrounding edema. 2.6 x 2.5 cm peripherally enhancing metastasis within the medial right cerebellar hemisphere with surrounding edema. A brain MRI without and with contrast is recommended for further evaluation. 4. Please refer to the concurrently performed and separately reported chest CT for a description of intrathoracic findings, including but not limited to a large right upper lobe lung mass. 5. Atherosclerotic plaque within the visualized aortic arch, proximal major branch vessels of the neck and carotid arteries. Aortic Atherosclerosis (ICD10-I70.0). 6. Cervical spondylosis, as described. Electronically Signed   By: Kellie Simmering D.O.   On: 09/05/2021 11:20   CT CHEST LUNG CA SCREEN LOW DOSE W/O CM  Result Date: 09/06/2021 CLINICAL DATA:  61 year old male current smoker with 120 pack-year history of smoking. Lung cancer screening examination. EXAM: CT CHEST WITHOUT CONTRAST LOW-DOSE FOR LUNG CANCER SCREENING TECHNIQUE: Multidetector CT imaging of the chest was performed following the standard protocol without IV contrast. RADIATION DOSE REDUCTION: This exam was performed according to the departmental dose-optimization program which includes automated exposure control, adjustment of the mA and/or kV  according to patient size and/or use of iterative reconstruction technique. COMPARISON:  No priors. FINDINGS: Cardiovascular: Heart size is normal. There is no significant pericardial fluid, thickening or pericardial calcification. There is aortic atherosclerosis, as well as atherosclerosis of the great vessels of the mediastinum and the coronary arteries, including calcified atherosclerotic plaque in the left anterior descending and right coronary arteries. Mild calcifications of the mitral annulus. Mediastinum/Nodes: Bulky subcarinal lymphadenopathy measuring up to 2.2 cm in short axis. Fullness in the right hilar region poorly evaluated on today's noncontrast CT examination, but suspicious for right hilar lymphadenopathy. Bulky low right paratracheal lymph node measuring 2.2 cm in short axis. Other high right paratracheal lymphadenopathy measuring up to 1.8 cm in short axis in the superior mediastinum. Esophagus is unremarkable in appearance. No axillary lymphadenopathy. Lungs/Pleura: Large macrolobulated partially cavitary spiculated right upper lobe mass (axial image 70) with a volume derived mean diameter of 6.2 cm, highly concerning for primary bronchogenic carcinoma such as a squamous cell carcinoma. Several other smaller pulmonary nodules are also noted. No acute consolidative airspace disease. No pleural effusions. Mild diffuse bronchial wall thickening with mild centrilobular and paraseptal emphysema. Upper Abdomen: Aortic atherosclerosis. 2.2 x 1.2 cm intermediate attenuation (29 HU) right adrenal nodule. 3.1 x 2.4 cm intermediate attenuation (37 HU) left adrenal mass. Musculoskeletal: There are no aggressive appearing lytic or blastic lesions noted in the visualized portions of the skeleton. IMPRESSION: 1. 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  This is associated with probable right hilar and  mediastinal lymphadenopathy, as well as bilateral adrenal nodules which may represent metastatic lesions. Additional imaging evaluation or consultation with Pulmonology or Thoracic Surgery recommended. 2. The "S" modifier above refers to potentially clinically significant non lung cancer related findings. Specifically, there is aortic atherosclerosis, in addition to two-vessel coronary artery disease. Please note that although the presence of coronary artery calcium documents the presence of coronary artery disease, the severity of this disease and any potential stenosis cannot be assessed on this non-gated CT examination. Assessment for potential risk factor modification, dietary therapy or pharmacologic therapy may be warranted, if clinically indicated. 3. Mild diffuse bronchial wall thickening with mild centrilobular and paraseptal emphysema; imaging findings suggestive of underlying COPD. 4. There are calcifications of the mitral annulus. Echocardiographic correlation for evaluation of potential valvular dysfunction may be warranted if clinically indicated. These results will be called to the ordering clinician or representative by the Radiologist Assistant, and communication documented in the PACS or [employer]. Aortic Atherosclerosis (ICD10-I70.0) and Emphysema (ICD10-J43.9). Electronically Signed   By: Vinnie Langton M.D.   On: 09/06/2021 06:36    Assessment and Plan:   Right lung mass, neck adenopathy, brain metastases Rheumatoid arthritis Expressive aphasia, right-sided weakness, and altered mental status-likely secondary to brain metastases History of tobacco and alcohol use Hyponatremia on a chemistry panel 08/15/2021  Douglas Holt is a 61 year old man recently found to have a right lung mass, neck adenopathy and brain metastases.  He is symptomatic with headache, dysarthria, expressive aphasia and seems to have right-sided weakness.  He and  his brother understand hospitalization is the recommendation for urgent evaluation/intervention.  Dr. Benay Spice reviewed the likely diagnosis of metastatic lung cancer with Douglas Holt and his brother.  He was given 10 mg of IV Decadron in the office and also 2 mg of IV morphine.  Dr. Benay Spice has spoken with the hospitalist and arrangements are being made for admission to [facility]  Patient seen with Dr. Benay Spice.    Ned Card, NP 09/06/2021, 1:31 PM   This was a shared visit with Ned Card.  Douglas Holt was interviewed and examined.  His brother was present for today's visit.  We reviewed CT images with his brother.  Douglas Holt appeared agitated and had difficulty participating in discussion, likely secondary to symptomatic brain metastases.  Douglas Holt presents with neurologic symptoms.  Brain metastases were identified on a neck CT.  He has palpable neck lymphadenopathy.  A CT of the chest reveals a large right lung mass and mediastinal lymphadenopathy.  He appears to have metastatic lung cancer.  We discussed the probable diagnosis with Douglas Holt and his brother.  He needs a biopsy to confirm the diagnosis and distinguish between non-small cell and small cell lung cancer.  We explained the serious nature of his illness and the need for prompt evaluation and treatment.  He agrees to hospital admission.  We administer intravenous Decadron in the office today.  We consulted radiation oncology.  I discussed the case with Dr. Chana Bode.  He will be admitted for continuation of IV Decadron, a diagnostic biopsy, and initiation of treatment depending on the biopsy result.  I was present for greater than 50% of today's visit.  I performed medical decision making.  Julieanne Manson, MD

## 2021-09-06 NOTE — H&P (Signed)
History and Physical    Douglas Holt DOB: Aug 30, 1960 DOA: 09/06/2021  PCP: Maximiano Coss, NP   Patient coming from: Home; [facility] to [place] Visit and sent to [facility]   Chief Complaint: Difficulty Speaking, Head Pressure  HPI: Douglas Holt is a 61 y.o. 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  Patient had rapid deterioration and his results on his imaging was concerning given that he had speech difficulties for about the last few weeks and a poor appetite.  He was evaluated by the oncologist today for evaluation of a lung mass, neck adenopathy as well as brain metastasis.  It was recommended that he go to the ED yesterday however he refuses but was agreeable to coming to the oncology clinic today.  He had a CT of the chest and neck CT yesterday with findings of cavitary right upper lobe mass and probable right hilar medicine lymphadenopathy, bilateral adrenal nodules and a right neck adenopathy as well as necrotic lesions in the right paraspinal musculature, peripheral enhancing metastasis within the medial right cerebellar hemorrhage with surrounding edema.  He continues to have aphasia and difficulty speaking properly for last few weeks with garbled speech and patient's mother noticed that he also is having trouble using his right hand.  Patient's brother was concerned that he is losing weight and appetite appears poor and patient is having some head pressure.  At the urging of the oncologist he was sent to the hospital and given 10 mg of IV Decadron for further work-up and evaluation for his brain metastasis.  Interventional radiology was consulted for a node biopsy and repeat  head CT scan within contrast was ordered given recommendations of the oncologist.  Patient was given IV Decadron 10 mg in the clinic and will be placed on scheduled Decadron now.  Dr. Malachy Mood has already consulted radiation oncology for further evaluation recommendations.   ED Course: No ED course as he was sent from the clinic  Review of Systems: As per HPI otherwise all other systems reviewed and negative.   Past Medical History:  Diagnosis Date   Back pain    collasped disc    Past Surgical History:  Procedure Laterality Date   INGUINAL HERNIA REPAIR  10/02/2011   Procedure: HERNIA REPAIR INGUINAL ADULT;  Surgeon: Jamesetta So, MD;  Location: [facility];  Service: General;  Laterality: Right;   LUMBAR [place]  reports that he has been smoking cigarettes. He has a 80.00 pack-year smoking history. He does not have any smokeless tobacco history on file. He reports current alcohol use of about 28.0 standard drinks per week. He reports that he does not use drugs.  ALLERGIES No Known Allergies  FAMILY HISTORY No family pertinent to the patient's presenting complaint   Prior to Admission medications   Medication Sig Start Date End Date Taking? Authorizing Provider  aspirin 325 MG tablet Take 325 mg by mouth every 6 (six) hours as needed. For pain    [provider]  doxycycline (VIBRA-TABS) 100 MG tablet Take 1 tablet (100 mg total) by mouth 2 (two) times daily. 08/15/21   Maximiano Coss, NP  predniSONE (  STERAPRED UNI-PAK 21 TAB) 10 MG (21) TBPK tablet Take per package instructions. Do not skip doses. Finish entire supply. 08/15/21   Maximiano Coss, NP  Tetrahydrozoline HCl (VISINE OP) Apply 1-2 drops to eye as needed. For red eyes    [provider]   Physical Exam: Vitals:   09/06/21 1544  BP: (!) 146/89  Pulse: 80  Resp: 14  Temp: 100 F (37.8 C)  TempSrc: Oral  SpO2: 100%   Constitutional: WN/WD, NAD and appears calm and  comfortable Eyes: PERRL, lids and conjunctivae normal, sclerae anicteric  ENMT: External Ears, Nose appear normal. Grossly normal hearing. Mucous membranes are moist. Posterior pharynx clear of any exudate or lesions. Normal dentition.  Neck: Appears normal, supple, no cervical masses, normal ROM, no appreciable thyromegaly Respiratory: Clear to auscultation bilaterally, no wheezing, rales, rhonchi or crackles. Normal respiratory effort and patient is not tachypenic. No accessory muscle use.  Cardiovascular: RRR, no murmurs / rubs / gallops. S1 and S2 auscultated. No extremity edema. 2+ pedal pulses. No carotid bruits.  Abdomen: Soft, non-tender, non-distended. No masses palpated. No appreciable hepatosplenomegaly. Bowel sounds positive.  GU: Deferred. Musculoskeletal: No clubbing / cyanosis of digits/nails. No joint deformity upper and lower extremities. Good ROM, no contractures. Normal strength and muscle tone.  Skin: No rashes, lesions, ulcers. No induration; Warm and dry.  Neurologic: CN 2-12 grossly intact with no focal deficits. Sensation intact in all 4 Extremities, DTR normal. Strength 5/5 in all 4. Romberg sign cerebellar reflexes not assessed.  Psychiatric: Normal judgment and insight. Alert and oriented x 3. Normal mood and appropriate affect.   Labs on Admission: I have personally reviewed following labs and imaging studies  CBC: No results for input(s): WBC, NEUTROABS, HGB, HCT, MCV, PLT in the last 168 hours. Basic Metabolic Panel: No results for input(s): NA, K, CL, CO2, GLUCOSE, BUN, CREATININE, CALCIUM, MG, PHOS in the last 168 hours. GFR: CrCl cannot be calculated (Patient's most recent lab result is older than the maximum 21 days allowed.). Liver Function Tests: No results for input(s): AST, ALT, ALKPHOS, BILITOT, PROT, ALBUMIN in the last 168 hours. No results for input(s): LIPASE, AMYLASE in the last 168 hours. No results for input(s): AMMONIA in the last 168  hours. Coagulation Profile: No results for input(s): INR, PROTIME in the last 168 hours. Cardiac Enzymes: No results for input(s): CKTOTAL, CKMB, CKMBINDEX, TROPONINI in the last 168 hours. BNP (last 3 results) No results for input(s): PROBNP in the last 8760 hours. HbA1C: No results for input(s): HGBA1C in the last 72 hours. CBG: No results for input(s): GLUCAP in the last 168 hours. Lipid Profile: No results for input(s): CHOL, HDL, LDLCALC, TRIG, CHOLHDL, LDLDIRECT in the last 72 hours. Thyroid Function Tests: No results for input(s): TSH, T4TOTAL, FREET4, T3FREE, THYROIDAB in the last 72 hours. Anemia Panel: No results for input(s): VITAMINB12, FOLATE, FERRITIN, TIBC, IRON, RETICCTPCT in the last 72 hours. Urine analysis: No results found for: COLORURINE, APPEARANCEUR, LABSPEC, PHURINE, GLUCOSEU, HGBUR, BILIRUBINUR, KETONESUR, PROTEINUR, UROBILINOGEN, NITRITE, LEUKOCYTESUR Sepsis Labs: !!!!!!!!!!!!!!!!!!!!!!!!!!!!!!!!!!!!!!!!!!!! @LABRCNTIP (procalcitonin:4,lacticidven:4) )No results found for this or any previous visit (from the past 240 hour(s)).   Radiological Exams on Admission: No results found.  EKG: Not done as he is directly admitted from clinic.   Assessment/Plan Principal Problem:   Malignant neoplasm metastatic to brain [facility]) Active Problems:   Mass of right lung   Expressive aphasia   Tobacco abuse  Cavitary right upper lobe mass with associated with right hilar mediastinal lymphadenopathy, right  neck adenopathy as well as necrotic lesions in the right paraspinal musculature and peripherally enhancing metastasis in the medial right cerebellar hemisphere with associated edema -Recently had a CT scan 2 days ago and presents with neurological symptoms.  Admit to inpatient MedSurg -Brain metastasis were identified on neck CT scan so he will need a proper CT scan of the head with and without contrast -Patient has palpable neck lymphadenopathy so interventional  radiology has been consulted for a biopsy to confirm diagnosis and distinguish between likely non-small cell cancer versus small cell cancer -Continue supportive care and start IV fluid hydration with normal saline at 100 MLS per hour -Check CBC, CMP, mag, Phos -Started on Decadron given his brain metastasis and likely surrounding edema causing his neurological symptoms and he is given 10 mg IV x1 in the clinic and will be started on scheduled Decadron 4 mg every 6 scheduled -We will need a diagnostic biopsy and initiation of treatment depending on the biopsy results and radiation oncology has also been consulted -Pain Control with Oxycodone IR and Acetaminophen  Expressive aphasia with right-sided weakness -Likely in the setting of his brain metastasis; symptoms have been persistent for the last few weeks or so if not longer -Get a head CT scan with and without contrast -Reportedly had a poor p.o. appetite so we will get nutritionist consult and SLP evaluation -Started on IV dexamethasone; may need neurology consult  Tobacco abuse -Smoking cessation counseling given -Provide nicotine patch if warranted and if patient has withdrawals  History of alcohol abuse -Placed on CIWA protocol and placed on thiamine, folic acid, multivitamin with minerals  Hyponatremia on recent labs -Repeat labs here and continue IV fluid hydration  Underweight -We will need a nutritionist consult which we have added  DVT prophylaxis: Heparin 5,000 units sq q8h Code Status: DO NOT RESUSCITATE Family Communication: Discussed with Brother at bedside  Disposition Plan: Pending further clinical improvement  Consults called: Medical Oncology; IR Admission status: Inpatient Med-Surge  Severity of Illness: The appropriate patient status for this patient is INPATIENT. Inpatient status is judged to be reasonable and necessary in order to provide the required intensity of service to ensure the patient's safety. The  patient's presenting symptoms, physical exam findings, and initial radiographic and laboratory data in the context of their chronic comorbidities is felt to place them at high risk for further clinical deterioration. Furthermore, it is not anticipated that the patient will be medically stable for discharge from the hospital within 2 midnights of admission.   * I certify that at the point of admission it is my clinical judgment that the patient will require inpatient hospital care spanning beyond 2 midnights from the point of admission due to high intensity of service, high risk for further deterioration and high frequency of surveillance required.Kerney Elbe, D.O. [facility] PAGER is on [place]  If 7PM-7AM, please contact night-coverage [email or web address]  09/06/2021, 5:32 PM

## 2021-09-07 ENCOUNTER — Ambulatory Visit: Payer: PPO | Admitting: Radiation Oncology

## 2021-09-07 ENCOUNTER — Inpatient Hospital Stay (HOSPITAL_COMMUNITY): Payer: PPO

## 2021-09-07 DIAGNOSIS — C7931 Secondary malignant neoplasm of brain: Secondary | ICD-10-CM | POA: Diagnosis not present

## 2021-09-07 DIAGNOSIS — E43 Unspecified severe protein-calorie malnutrition: Secondary | ICD-10-CM | POA: Insufficient documentation

## 2021-09-07 LAB — CBC WITH DIFFERENTIAL/PLATELET
Abs Immature Granulocytes: 0.13 10*3/uL — ABNORMAL HIGH (ref 0.00–0.07)
Basophils Absolute: 0 10*3/uL (ref 0.0–0.1)
Basophils Relative: 0 %
Eosinophils Absolute: 0 10*3/uL (ref 0.0–0.5)
Eosinophils Relative: 0 %
HCT: 33.4 % — ABNORMAL LOW (ref 39.0–52.0)
Hemoglobin: 11.1 g/dL — ABNORMAL LOW (ref 13.0–17.0)
Immature Granulocytes: 1 %
Lymphocytes Relative: 5 %
Lymphs Abs: 0.7 10*3/uL (ref 0.7–4.0)
MCH: 27.3 pg (ref 26.0–34.0)
MCHC: 33.2 g/dL (ref 30.0–36.0)
MCV: 82.3 fL (ref 80.0–100.0)
Monocytes Absolute: 0.6 10*3/uL (ref 0.1–1.0)
Monocytes Relative: 4 %
Neutro Abs: 13.7 10*3/uL — ABNORMAL HIGH (ref 1.7–7.7)
Neutrophils Relative %: 90 %
Platelets: 458 10*3/uL — ABNORMAL HIGH (ref 150–400)
RBC: 4.06 MIL/uL — ABNORMAL LOW (ref 4.22–5.81)
RDW: 15.9 % — ABNORMAL HIGH (ref 11.5–15.5)
WBC: 15 10*3/uL — ABNORMAL HIGH (ref 4.0–10.5)
nRBC: 0 % (ref 0.0–0.2)

## 2021-09-07 LAB — COMPREHENSIVE METABOLIC PANEL
ALT: 21 U/L (ref 0–44)
AST: 17 U/L (ref 15–41)
Albumin: 2.3 g/dL — ABNORMAL LOW (ref 3.5–5.0)
Alkaline Phosphatase: 63 U/L (ref 38–126)
Anion gap: 6 (ref 5–15)
BUN: 10 mg/dL (ref 6–20)
CO2: 24 mmol/L (ref 22–32)
Calcium: 7.9 mg/dL — ABNORMAL LOW (ref 8.9–10.3)
Chloride: 103 mmol/L (ref 98–111)
Creatinine, Ser: 0.56 mg/dL — ABNORMAL LOW (ref 0.61–1.24)
GFR, Estimated: >60 mL/min (ref >60)
Glucose, Bld: 131 mg/dL — ABNORMAL HIGH (ref 70–99)
Potassium: 3.7 mmol/L (ref 3.5–5.1)
Sodium: 133 mmol/L — ABNORMAL LOW (ref 135–145)
Total Bilirubin: 0.5 mg/dL (ref 0.3–1.2)
Total Protein: 6.5 g/dL (ref 6.5–8.1)

## 2021-09-07 LAB — GLUCOSE, CAPILLARY: Glucose-Capillary: 142 mg/dL — ABNORMAL HIGH (ref 70–99)

## 2021-09-07 LAB — PHOSPHORUS: Phosphorus: 2.6 mg/dL (ref 2.5–4.6)

## 2021-09-07 LAB — HIV ANTIBODY (ROUTINE TESTING W REFLEX): HIV Screen 4th Generation wRfx: NONREACTIVE

## 2021-09-07 LAB — PROTIME-INR
INR: 1.3 — ABNORMAL HIGH (ref 0.8–1.2)
Prothrombin Time: 15.9 seconds — ABNORMAL HIGH (ref 11.4–15.2)

## 2021-09-07 LAB — MAGNESIUM: Magnesium: 2.1 mg/dL (ref 1.7–2.4)

## 2021-09-07 MED ORDER — IOHEXOL 300 MG/ML  SOLN
75.0000 mL | Freq: Once | INTRAMUSCULAR | Status: AC | PRN
  Administered 2021-09-07: 75 mL via INTRAVENOUS

## 2021-09-07 MED ORDER — MIDAZOLAM HCL 2 MG/2ML IJ SOLN
INTRAMUSCULAR | Status: AC | PRN
  Administered 2021-09-07: 1 mg via INTRAVENOUS

## 2021-09-07 MED ORDER — FENTANYL CITRATE (PF) 100 MCG/2ML IJ SOLN
INTRAMUSCULAR | Status: AC
  Filled 2021-09-07: qty 2

## 2021-09-07 MED ORDER — FENTANYL CITRATE (PF) 100 MCG/2ML IJ SOLN
INTRAMUSCULAR | Status: AC | PRN
  Administered 2021-09-07: 50 ug via INTRAVENOUS

## 2021-09-07 MED ORDER — ENSURE ENLIVE PO LIQD
237.0000 mL | Freq: Three times a day (TID) | ORAL | Status: DC
  Administered 2021-09-07 – 2021-09-12 (×15): 237 mL via ORAL

## 2021-09-07 MED ORDER — LIDOCAINE HCL 1 % IJ SOLN
INTRAMUSCULAR | Status: AC
  Administered 2021-09-07: 10 mL
  Filled 2021-09-07: qty 20

## 2021-09-07 MED ORDER — MIDAZOLAM HCL 2 MG/2ML IJ SOLN
INTRAMUSCULAR | Status: AC
  Filled 2021-09-07: qty 2

## 2021-09-07 NOTE — Progress Notes (Signed)
Patient refused labs, rescheduled for 0800

## 2021-09-07 NOTE — Procedures (Signed)
Interventional Radiology Procedure:   Indications: Metastatic disease and needs tissue diagnosis  Procedure: US guided core biopsy of right supraclavicular lymph node  Findings: 5 core biopsies from right supraclavicular lymph node.  Complications: No immediate complications noted.     EBL: Minimal  Plan: Return to inpatient room   Alicha Raspberry R. Anselm Pancoast, MD  Pager: [phone]

## 2021-09-07 NOTE — Progress Notes (Signed)
Initial Nutrition Assessment  DOCUMENTATION CODES:   Severe malnutrition in context of chronic illness, Underweight  INTERVENTION:   -Ensure Plus High Protein po TID, each supplement provides 350 kcal and 20 grams of protein.   NUTRITION DIAGNOSIS:   Severe Malnutrition related to chronic illness (new lung mass with brain mets) as evidenced by percent weight loss, mild fat depletion, severe muscle depletion, energy intake < or equal to 75% for > or equal to 1 month.  GOAL:   Patient will meet greater than or equal to 90% of their needs  MONITOR:   PO intake, Supplement acceptance, Labs, Weight trends, I & O's, Skin  REASON FOR ASSESSMENT:   Consult Assessment of nutrition requirement/status  ASSESSMENT:   61 y.o. 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  Patient had rapid deterioration and his results on his imaging was concerning given that he had speech difficulties for about the last few weeks and a poor appetite.  He was evaluated by the oncologist today for evaluation of a lung mass, neck adenopathy as well as brain metastasis.  Patient in room with brother at bedside. Pt attempts to communicate, often is understandable. Gets a bit frustrated he cannot say everything he wants. Pt states he has never been a big eater. Per brother, pt has always been thin but never a big eater.  Pt states he would drink beer (not sure when he was consuming), and one big meal at the end of the day. Denies he has been having swallowing difficulty. Poor appetite for a week . He is agreeable to trying chocolate Ensure once diet is advanced.  Currently feels hungry. NPO, awaiting biopsy of cervical lymph node.   Reports lately UBW is 115-120 lbs. At one point  he weighed 130 lbs. Current weight: 112 lbs. Pt has lost 7 lbs since 5/10, this is a 5% wt loss x <1 month, significant for time frame.   Medications: Colace, Multivitamin with minerals daily, Thiamine, Folic acid  Labs reviewed:  CBGs: 142 Low Na  NUTRITION - FOCUSED PHYSICAL EXAM:  Flowsheet Row Most Recent Value  Orbital Region Mild depletion  Upper Arm Region --  [rt sided weakness]  Thoracic and Lumbar Region Unable to assess  Buccal Region Moderate depletion  Temple Region Moderate depletion  Clavicle Bone Region Severe depletion  Clavicle and Acromion Bone Region Severe depletion  Scapular Bone Region Severe depletion  Dorsal Hand No depletion  Patellar Region Unable to assess  Anterior Thigh Region Unable to assess  Posterior Calf Region Unable to assess  Hair Reviewed  [thin]  Eyes Reviewed  Mouth Unable to assess  Skin Reviewed       Diet Order:   Diet Order             Diet NPO time specified Except for: Sips with Meds  Diet effective midnight                   EDUCATION NEEDS:   No education needs have been identified at this time  Skin:  Skin Assessment: Reviewed RN Assessment  Last BM:  PTA  Height:   Ht Readings from Last 1 Encounters:  09/06/21 5' 9.5" (1.765 m)    Weight:   Wt Readings  from Last 1 Encounters:  09/06/21 51.3 kg    BMI:  16.5 kg/m^2  Estimated Nutritional Needs:   Kcal:  1850-2050  Protein:  105-120g  Fluid:  2L/day   Clayton Bibles, MS, RD, LDN Inpatient Clinical Dietitian Contact information available via Amion

## 2021-09-07 NOTE — Progress Notes (Signed)
PROGRESS NOTE    Douglas Holt  RCV:[id number] DOB: 1960-07-31 DOA: 09/06/2021 PCP: Maximiano Coss, NP   Brief Narrative:  Douglas Holt is a 61 y.o. 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  Patient had rapid deterioration and his results on his imaging was concerning given that he had speech difficulties for about the last few weeks and a poor appetite.  He was evaluated by the oncologist today for evaluation of a lung mass, neck adenopathy as well as brain metastasis.  It was recommended that he go to the ED yesterday however he refuses but was agreeable to coming to the oncology clinic today.  He had a CT of the chest and neck CT yesterday with findings of cavitary right upper lobe mass and probable right hilar medicine lymphadenopathy, bilateral adrenal nodules and a right neck adenopathy as well as necrotic lesions in the right paraspinal musculature, peripheral enhancing metastasis within the medial right cerebellar hemorrhage with surrounding edema.  He continues to have aphasia and difficulty speaking properly for last few weeks with garbled speech and patient's mother noticed that he also is having trouble using his right hand.  Patient's brother was concerned that he is losing weight and appetite appears poor and patient is having some head pressure.  At the urging of the oncologist he was sent to the hospital and given 10 mg of IV Decadron for further work-up and evaluation for his brain metastasis.  Interventional radiology was consulted for a node biopsy and repeat head CT scan within contrast was ordered given recommendations of the oncologist.  Patient was given IV Decadron 10 mg in the  clinic and will be placed on scheduled Decadron now.  Dr. Benay Spice has already consulted radiation oncology for further evaluation recommendations.   Assessment and Plan:  Cavitary right upper lobe mass with associated with right hilar mediastinal lymphadenopathy, right neck adenopathy as well as necrotic lesions in the right paraspinal musculature and peripherally enhancing metastasis in the medial right cerebellar hemisphere with associated edema -Recently had a CT scan 3 days ago and presents with neurological symptoms.  Admit to inpatient MedSurg -Brain metastasis were identified on neck CT scan so he will need a proper CT scan of the head with and without contrast and this was ordered but not done yet -Radiation oncology evaluated and wants to get a baseline MRI and see what they are treating and so they recommended MRI of the brain with and without contrast as well as an MRI of the cervical spine but the patient has refused MRIs at this time -Given this radiation oncology will defer his simulation today and reevaluate next week if patient is agreeable -Patient has palpable neck lymphadenopathy so interventional radiology has been consulted for a biopsy to confirm diagnosis and distinguish between likely non-small cell cancer versus small cell cancer; biopsies pending to be done -Continue supportive care and start IV fluid hydration with normal saline at 100 MLS  -Check CBC, CMP, mag, Phos -Started on Decadron given his brain metastasis and likely surrounding edema causing his neurological symptoms and he is given 10 mg IV x1 in the clinic and will be started on scheduled Decadron 4 mg every  6 scheduled -We will need a diagnostic biopsy and initiation of treatment depending on the biopsy results and radiation oncology has also been consulted -Pain Control with Oxycodone IR and Acetaminophen -Palliative care has been consulted as well as radiation oncology -Radiation oncology is consulted for  brain stimulation and they have recommended a baseline MRI to evaluate with her treating and to monitor disease progression but patient refuses -He is undergoing a biopsy later today at some point -TOC has been assisted for disposition planning and PT OT are evaluating   Expressive aphasia with right-sided weakness -Likely in the setting of his brain metastasis; symptoms have been persistent for the last few weeks or so if not longer -Get a head CT scan with and without contrast -Reportedly had a poor p.o. appetite so we will get nutritionist consult and SLP evaluation -Started on IV dexamethasone; may need neurology consult -Radiation oncology has ordered MRIs of his brain and cervical spine but patient is refused this and other interventions -Per radiation oncology his CT of the neck is not an ideal study to truly know what they would be treating and when I have a baseline to compare for response to later -They feel a CT of the head is not as ideal but would be better than what they have currently still because of the patient refusing to obtain a MRI for baseline purposes patient simulation for radiation has been canceled -We will continue with steroids and trying to see if we will be for brain imaging at a later time -Palliative care has been consulted as well for possible discussion for hospice at home if patient continues to refuse interventions  Tobacco abuse -Smoking cessation counseling given -Provide nicotine patch if warranted and if patient has withdrawals  Leukocytosis -In the setting of steroid demargination but a month ago at 12.5-16.2 yesterday and is now 15.0 -Patient received IV dexamethasone 10 mg x 1 and is now on scheduled dexamethasone 4 mg every 6 scheduled -Continue to monitor for signs and symptoms of infection -Repeat CBC in a.m.   History of alcohol abuse -Placed on CIWA protocol and placed on thiamine, folic acid, multivitamin with minerals   Hyponatremia on  recent labs -Repeat labs here and continue IV fluid hydration -Sodium was 128 is now improving and is now 133 -Continue to monitor and trend and repeat CMP in a.m.  Hypoalbuminemia -Mild patient's albumin went from 2.9 and trended down to two-point -See nutrition consult as below   Severe malnutrition in the context of chronic illness underweight -Nutrition was consulted and appreciate further evaluation recommendations -Nutrition Status: Nutrition Problem: Severe Malnutrition Etiology: chronic illness (new lung mass with brain mets) Signs/Symptoms: percent weight loss, mild fat depletion, severe muscle depletion, energy intake < or equal to 75% for > or equal to 1 month Interventions: Ensure Enlive (each supplement provides 350kcal and 20 grams of protein), MVI   DVT prophylaxis: heparin injection 5,000 Units Start: 09/07/21 2200 SCDs Start: 09/06/21 1720    Code Status: DNR Family Communication: Discussed with brother at bedside  Disposition Plan:  Level of care: Med-Surg Status is: Inpatient Remains inpatient appropriate because: Needs further work-up of his metastatic disease   Consultants:  Interventional radiology Medical oncology Radiation oncology Palliative care medicine  Procedures:  Undergoing biopsy later today  Antimicrobials:  Anti-infectives (From admission, onward)    None       Subjective: Seen and examined at bedside he is still having some difficulty with expressing himself and continues to  have some expressive aphasia.  He adamantly is refusing his MRI because he does not think he can lay flat given his anxiety.  No nausea or vomiting.  Denies any chest pain or shortness breath.  No other concerns or complaints at this time.  Objective: Vitals:   09/07/21 1340 09/07/21 1345 09/07/21 1350 09/07/21 1415  BP: (!) 142/91 140/90 (!) 144/84   Pulse: 84 93 93 96  Resp: 14 11 12    Temp:      TempSrc:      SpO2: 100% 100% 100% 100%    Intake/Output  Summary (Last 24 hours) at 09/07/2021 1449 Last data filed at 09/07/2021 0762 Gross per 24 hour  Intake 320.34 ml  Output --  Net 320.34 ml   There were no vitals filed for this visit.  Examination: Physical Exam:  Constitutional: Thin cachectic chronically ill-appearing Caucasian male currently no acute distress appears calm Respiratory: Diminished to auscultation bilaterally, no wheezing, rales, rhonchi or crackles. Normal respiratory effort and patient is not tachypenic. No accessory muscle use.  Unlabored breathing Cardiovascular: RRR, no murmurs / rubs / gallops. S1 and S2 auscultated. No extremity edema. Abdomen: Soft, non-tender, non-distended. Bowel sounds positive.  GU: Deferred. Musculoskeletal: No clubbing / cyanosis of digits/nails. No joint deformity upper and lower extremities. Skin: No rashes, lesions, ulcers but has multiple tattoos scattered throughout Neurologic: He has severe expressive aphasia Psychiatric: Normal judgment and insight. Alert and oriented x 3. Normal mood and appropriate affect.   Data Reviewed: I have personally reviewed following labs and imaging studies  CBC: Recent Labs  Lab 09/06/21 1707 09/07/21 0748  WBC 16.2* 15.0*  NEUTROABS  --  13.7*  HGB 11.8* 11.1*  HCT 35.8* 33.4*  MCV 83.3 82.3  PLT 475* 263*   Basic Metabolic Panel: Recent Labs  Lab 09/06/21 1707 09/07/21 0748  NA 130* 133*  K 3.7 3.7  CL 98 103  CO2 24 24  GLUCOSE 221* 131*  BUN 10 10  CREATININE 0.74 0.56*  CALCIUM 7.9* 7.9*  MG 2.1 2.1  PHOS 3.0 2.6   GFR: Estimated Creatinine Clearance: 72.1 mL/min (A) (by C-G formula based on SCr of 0.56 mg/dL (L)). Liver Function Tests: Recent Labs  Lab 09/06/21 1707 09/07/21 0748  AST 24 17  ALT 23 21  ALKPHOS 68 63  BILITOT 0.8 0.5  PROT 6.9 6.5  ALBUMIN 2.5* 2.3*   No results for input(s): LIPASE, AMYLASE in the last 168 hours. No results for input(s): AMMONIA in the last 168 hours. Coagulation Profile: Recent  Labs  Lab 09/07/21 0748  INR 1.3*   Cardiac Enzymes: No results for input(s): CKTOTAL, CKMB, CKMBINDEX, TROPONINI in the last 168 hours. BNP (last 3 results) No results for input(s): PROBNP in the last 8760 hours. HbA1C: No results for input(s): HGBA1C in the last 72 hours. CBG: Recent Labs  Lab 09/07/21 0746  GLUCAP 142*   Lipid Profile: No results for input(s): CHOL, HDL, LDLCALC, TRIG, CHOLHDL, LDLDIRECT in the last 72 hours. Thyroid Function Tests: No results for input(s): TSH, T4TOTAL, FREET4, T3FREE, THYROIDAB in the last 72 hours. Anemia Panel: No results for input(s): VITAMINB12, FOLATE, FERRITIN, TIBC, IRON, RETICCTPCT in the last 72 hours. Sepsis Labs: No results for input(s): PROCALCITON, LATICACIDVEN in the last 168 hours.  No results found for this or any previous visit (from the past 240 hour(s)).   Radiology Studies: No results found.   Scheduled Meds:  dexamethasone (DECADRON) injection  4 mg Intravenous Q6H  docusate sodium  100 mg Oral BID   feeding supplement  237 mL Oral TID BM   folic acid  1 mg Oral Daily   heparin  5,000 Units Subcutaneous Q8H   multivitamin with minerals  1 tablet Oral Daily   thiamine  100 mg Oral Daily   Or   thiamine  100 mg Intravenous Daily   Continuous Infusions:  sodium chloride 100 mL/hr at 09/07/21 0843    LOS: 1 day   Douglas Noble, DO [facility] Available via Epic secure chat 7am-7pm After these hours, please refer to coverage provider listed on [email or web address] 09/07/2021, 2:49 PM

## 2021-09-07 NOTE — Progress Notes (Addendum)
HEMATOLOGY-ONCOLOGY PROGRESS NOTE  ASSESSMENT AND PLAN: Right lung mass, neck adenopathy, brain metastases Rheumatoid arthritis Expressive aphasia, right-sided weakness, and altered mental status-likely secondary to brain metastases History of tobacco and alcohol use Hyponatremia on a chemistry panel 08/15/2021  Douglas Holt was recently found to have a right lung mass as well as neck adenopathy and brain metastases.  He was seen for initial consultation in our office and was symptomatic from his brain metastases with headache, dysarthria, expressive aphasia, and right-sided weakness.  Hospital admission was recommended for expedited work-up.  He has been started on dexamethasone.  He has some improvement in his headaches and expressive aphasia today.  Radiation oncology has been consulted for consideration of radiation to his brain.  MRI has been recommended but he refuses this.  CT of the head with and without contrast has been ordered.  Continue dexamethasone.  IR has been consulted to obtain a biopsy.  Once we have the diagnosis, we will have additional discussion with the patient and his family was regarding systemic treatment options.  Recommend PT consult.  TOC referral has been placed to assist with disposition planning.  Recommendations: 1.  CT of the head with and without contrast. 2.  Radiation per radiation oncology. 3.  Continue dexamethasone-taper per radiation oncology 4.  Ultrasound-guided biopsy of cervical lymph node today by IR. 5.  PT evaluation. 6.  TOC referral placed for disposition planning 7.  We will have additional discussion with the patient and family regarding systemic treatment options once biopsy results are available. 8.  Call oncology as needed over the weekend, outpatient follow-up will be scheduled at the [place] to discharge the patient to home over the weekend if he is eating and drinking well and more mobile.  We will arrange outpatient  follow-up at the cancer center to discuss treatment options.   Douglas Holt was interviewed and examined.  He appears much improved compared to when I saw him yesterday.  Dysarthria has improved and his headache has resolved.  I discussed the diagnostic plan with him.  He will be referred for a head CT and biopsy of a neck lymph node.  He will see radiation oncology to discuss palliative radiation to the brain.  I recommend continuing Decadron.  This can be tapered as directed by radiation oncology.  We will make systemic treatment recommendations after the lymph node pathology returns.  He can be discharged to home or with family if he is ambulatory and his neurologic status has improved.  I will check on him 09/10/2021 if he remains in the hospital.  Outpatient follow-up will be scheduled at the Cancer center.    ` SUBJECTIVE: Douglas Holt was seen in initial consultation in our office yesterday.  He was seen due to abnormal imaging findings including a lung mass, neck adenopathy, and brain metastases.  He was symptomatic for his brain metastases and was referred to the hospital for admission and expedited work-up.  Radiation oncology has consulted.  An MRI of the brain has been ordered but the patient refused.  CT of the head has also been ordered but not yet performed.  IR has seen the patient and is planning for a lymph node biopsy today.  He has been started on dexamethasone.  This morning, the patient's brother is at the bedside.  He does have some improvement in his expressive aphasia and his headaches have also improved.  Still not agreeable to proceeding with MRI but agreeable to CT of  the head.  Douglas Bussing, NP   PHYSICAL EXAMINATION:  Vitals:   09/06/21 2354 09/07/21 0656  BP: 132/82 115/79  Pulse: (!) 113 82  Resp: 20 18  Temp: 97.6 F (36.4 C) 98.4 F (36.9 C)  SpO2: 96% 100%   There were no vitals filed for this visit.  Intake/Output from previous day: 06/01 0701 - 06/02  0700 In: 320.3 [I.V.:320.3] Out: -   Physical Exam Vitals reviewed.  HENT:     Head: Normocephalic.  Eyes:     General: No scleral icterus. Cardiovascular:     Rate and Rhythm: Normal rate and regular rhythm.  Pulmonary:     Comments: Diminished breath sounds on the right. Abdominal:     Palpations: Abdomen is soft.     Tenderness: There is no abdominal tenderness.  Lymphadenopathy:     Comments: Palpable right cervical and supraclavicular adenopathy.  Skin:    General: Skin is warm and dry.  Neurological:     Mental Status: He is alert.     Comments: Expressive aphasia noted.    LABORATORY DATA:  I have reviewed the data as listed    Latest Ref Rng & Units 09/07/2021    7:48 AM 09/06/2021    5:07 PM 08/15/2021    2:19 PM  CMP  Glucose 70 - 99 mg/dL 131   221   91    BUN 6 - 20 mg/dL 10   10   9     Creatinine 0.61 - 1.24 mg/dL 0.56   0.74   0.66    Sodium 135 - 145 mmol/L 133   130   128    Potassium 3.5 - 5.1 mmol/L 3.7   3.7   4.8    Chloride 98 - 111 mmol/L 103   98   94    CO2 22 - 32 mmol/L 24   24   27     Calcium 8.9 - 10.3 mg/dL 7.9   7.9   8.4    Total Protein 6.5 - 8.1 g/dL 6.5   6.9   7.6    Total Bilirubin 0.3 - 1.2 mg/dL 0.5   0.8   0.4    Alkaline Phos 38 - 126 U/L 63   68   99    AST 15 - 41 U/L 17   24   26     ALT 0 - 44 U/L 21   23   16       Lab Results  Component Value Date   WBC 15.0 (H) 09/07/2021   HGB 11.1 (L) 09/07/2021   HCT 33.4 (L) 09/07/2021   MCV 82.3 09/07/2021   PLT 458 (H) 09/07/2021   NEUTROABS 13.7 (H) 09/07/2021    No results found for: CEA1, CEA, [id number], CA125, PSA1  CT Soft Tissue Neck W Contrast  Result Date: 09/05/2021 CLINICAL DATA:  Provided history: Neck mass. Neck mass, nonpulsatile. Additional history provided by scanning technologist: Patient reports difficulty speaking, knots on right side of neck for 1-2 months. 120 pack-year current smoker. EXAM: CT NECK WITH CONTRAST TECHNIQUE: Multidetector CT imaging of the  neck was performed using the standard protocol following the bolus administration of intravenous contrast. RADIATION DOSE REDUCTION: This exam was performed according to the departmental dose-optimization program which includes automated exposure control, adjustment of the mA and/or kV according to patient size and/or use of iterative reconstruction technique. CONTRAST:  77mL ISOVUE-300 IOPAMIDOL (ISOVUE-300) INJECTION 61% COMPARISON:  None Available. FINDINGS: Pharynx and larynx: No appreciable  swelling or mass within the oral cavity, pharynx or larynx. Postinflammatory calcifications/tonsilloliths within the right palatine tonsil. Salivary glands: Atrophic appearance of the bilateral parotid glands. Unremarkable appearance of the submandibular glands. Thyroid: Unremarkable. Lymph nodes: 1.6 x 1.1 cm centrally cystic/necrotic and peripherally hyperenhancing right level 2/3 lymph node deep to the right-sided skin surface marker (series 2, image 62) (series 12, image 22). Numerous enlarged lymph nodes within the right lower neck, right retroclavicular region, partially imaged right axilla and partially imaged mediastinum. For instance, an enlarged right retroclavicular lymph node measures 2.5 x 1.5 cm in transaxial dimensions (series 10, image 60). Some of these lymph nodes are hyperenhancing and centrally cystic/necrotic change. The partially imaged mediastinal lymphadenopathy is extensive. Vascular: The major vascular structures of the neck are patent. Atherosclerotic plaque within the visualized aortic arch, proximal major branch vessels of the neck and carotid arteries. Limited intracranial: 2.4 x 2.0 cm enhancing metastasis within the left temporal lobe with prominent surrounding edema (series 2, image 2). 2.6 x 2.5 cm peripherally enhancing metastasis within the medial right cerebellar hemisphere with surrounding edema (series 2, image 27). Visualized orbits: The orbits are largely excluded from the field of  view. Mastoids and visualized paranasal sinuses: No significant paranasal sinus disease or mastoid effusion at the imaged levels. Skeleton: No acute bony abnormality or aggressive osseous lesion identified. C2-C3 grade 1 retrolisthesis. Cervical spondylosis with multilevel disc space narrowing, disc bulges/central disc protrusion, endplate spurring, uncovertebral hypertrophy and facet arthrosis. Disc space narrowing is greatest at C5-C6 and C6-C7 (advanced at these levels). Multilevel spinal canal stenosis. Most notably, a broad-based central disc protrusion contributes to at least moderate spinal canal stenosis at C5-C6. Multilevel bony neural foraminal narrowing. Upper chest: Please refer to the concurrently performed and separately reported chest CT for a description of intrathoracic findings, including but not limited to a large right upper lobe lung mass and extensive mediastinal lymphadenopathy. Other: 1.5 x 1.9 x 1.6 cm peripherally enhancing, centrally cystic/necrotic lesion within the paraspinal musculature at midline and to the right at the C2-C3 level (series 2, image 52). 2.1 x 2.4 x 1.8 cm peripherally enhancing and centrally cystic/necrotic lesion within the right paraspinal musculature at the T2-T3 level (series 2, image 92). These lesions likely reflect tumor deposits. Impressions 1,2,3,4 will be called to the ordering clinician or representative by the Radiologist Assistant, and communication documented in the PACS or [employer]. IMPRESSION: 1. 1.6 x 1.1 cm hyperenhancing and centrally cystic/necrotic right level 2/3 lymph node located immediately deep to the right-sided skin surface marker. This is consistent with nodal metastatic disease. Prominent metastatic lymphadenopathy is also present within the right lower neck, right retroclavicular region, partially imaged right axilla and partially imaged mediastinum. 2. Peripherally enhancing and centrally cystic/necrotic lesions in the right  paraspinal musculature at C2-C3 and T2-T3, as described. These lesions likely reflect metastatic tumor deposits. 3. 2.4 x 2.0 cm enhancing intracranial metastasis within the left temporal lobe with prominent surrounding edema. 2.6 x 2.5 cm peripherally enhancing metastasis within the medial right cerebellar hemisphere with surrounding edema. A brain MRI without and with contrast is recommended for further evaluation. 4. Please refer to the concurrently performed and separately reported chest CT for a description of intrathoracic findings, including but not limited to a large right upper lobe lung mass. 5. Atherosclerotic plaque within the visualized aortic arch, proximal major branch vessels of the neck and carotid arteries. Aortic Atherosclerosis (ICD10-I70.0). 6. Cervical spondylosis, as described. Electronically Signed   By: Kellie Simmering  D.O.   On: 09/05/2021 11:20   CT CHEST LUNG CA SCREEN LOW DOSE W/O CM  Result Date: 09/06/2021 CLINICAL DATA:  61 year old male current smoker with 120 pack-year history of smoking. Lung cancer screening examination. EXAM: CT CHEST WITHOUT CONTRAST LOW-DOSE FOR LUNG CANCER SCREENING TECHNIQUE: Multidetector CT imaging of the chest was performed following the standard protocol without IV contrast. RADIATION DOSE REDUCTION: This exam was performed according to the departmental dose-optimization program which includes automated exposure control, adjustment of the mA and/or kV according to patient size and/or use of iterative reconstruction technique. COMPARISON:  No priors. FINDINGS: Cardiovascular: Heart size is normal. There is no significant pericardial fluid, thickening or pericardial calcification. There is aortic atherosclerosis, as well as atherosclerosis of the great vessels of the mediastinum and the coronary arteries, including calcified atherosclerotic plaque in the left anterior descending and right coronary arteries. Mild calcifications of the mitral annulus.  Mediastinum/Nodes: Bulky subcarinal lymphadenopathy measuring up to 2.2 cm in short axis. Fullness in the right hilar region poorly evaluated on today's noncontrast CT examination, but suspicious for right hilar lymphadenopathy. Bulky low right paratracheal lymph node measuring 2.2 cm in short axis. Other high right paratracheal lymphadenopathy measuring up to 1.8 cm in short axis in the superior mediastinum. Esophagus is unremarkable in appearance. No axillary lymphadenopathy. Lungs/Pleura: Large macrolobulated partially cavitary spiculated right upper lobe mass (axial image 70) with a volume derived mean diameter of 6.2 cm, highly concerning for primary bronchogenic carcinoma such as a squamous cell carcinoma. Several other smaller pulmonary nodules are also noted. No acute consolidative airspace disease. No pleural effusions. Mild diffuse bronchial wall thickening with mild centrilobular and paraseptal emphysema. Upper Abdomen: Aortic atherosclerosis. 2.2 x 1.2 cm intermediate attenuation (29 HU) right adrenal nodule. 3.1 x 2.4 cm intermediate attenuation (37 HU) left adrenal mass. Musculoskeletal: There are no aggressive appearing lytic or blastic lesions noted in the visualized portions of the skeleton. IMPRESSION: 1. 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 This is associated with probable right hilar and mediastinal lymphadenopathy, as well as bilateral adrenal nodules which may represent metastatic lesions. Additional imaging evaluation or consultation with Pulmonology or Thoracic Surgery recommended. 2. The "S" modifier above refers to potentially clinically significant non lung cancer related findings. Specifically, there is aortic atherosclerosis, in addition to two-vessel coronary artery disease. Please note that although the presence of coronary  artery calcium documents the presence of coronary artery disease, the severity of this disease and any potential stenosis cannot be assessed on this non-gated CT examination. Assessment for potential risk factor modification, dietary therapy or pharmacologic therapy may be warranted, if clinically indicated. 3. Mild diffuse bronchial wall thickening with mild centrilobular and paraseptal emphysema; imaging findings suggestive of underlying COPD. 4. There are calcifications of the mitral annulus. Echocardiographic correlation for evaluation of potential valvular dysfunction may be warranted if clinically indicated. These results will be called to the ordering clinician or representative by the Radiologist Assistant, and communication documented in the PACS or [employer]. Aortic Atherosclerosis (ICD10-I70.0) and Emphysema (ICD10-J43.9). Electronically Signed   By: Vinnie Langton M.D.   On: 09/06/2021 06:36     Future Appointments  Date Time Provider [place]  09/07/2021  1:00 PM Kyung Rudd, MD [facility] None      LOS: 1 day

## 2021-09-07 NOTE — Progress Notes (Signed)
Asked patient again about MRI and still refused. Patient also refused medication for anxiety. Will alert day shift nurse to execute further steps.

## 2021-09-08 ENCOUNTER — Other Ambulatory Visit: Payer: Self-pay

## 2021-09-08 ENCOUNTER — Encounter (HOSPITAL_COMMUNITY): Payer: Self-pay | Admitting: Internal Medicine

## 2021-09-08 DIAGNOSIS — Z7189 Other specified counseling: Secondary | ICD-10-CM

## 2021-09-08 DIAGNOSIS — Z515 Encounter for palliative care: Secondary | ICD-10-CM

## 2021-09-08 LAB — COMPREHENSIVE METABOLIC PANEL
ALT: 20 U/L (ref 0–44)
AST: 19 U/L (ref 15–41)
Albumin: 2.3 g/dL — ABNORMAL LOW (ref 3.5–5.0)
Alkaline Phosphatase: 64 U/L (ref 38–126)
Anion gap: 5 (ref 5–15)
BUN: 17 mg/dL (ref 6–20)
CO2: 23 mmol/L (ref 22–32)
Calcium: 8 mg/dL — ABNORMAL LOW (ref 8.9–10.3)
Chloride: 104 mmol/L (ref 98–111)
Creatinine, Ser: 0.65 mg/dL (ref 0.61–1.24)
GFR, Estimated: >60 mL/min (ref >60)
Glucose, Bld: 114 mg/dL — ABNORMAL HIGH (ref 70–99)
Potassium: 3.5 mmol/L (ref 3.5–5.1)
Sodium: 132 mmol/L — ABNORMAL LOW (ref 135–145)
Total Bilirubin: 0.3 mg/dL (ref 0.3–1.2)
Total Protein: 6.4 g/dL — ABNORMAL LOW (ref 6.5–8.1)

## 2021-09-08 LAB — CBC WITH DIFFERENTIAL/PLATELET
Abs Immature Granulocytes: 0.32 10*3/uL — ABNORMAL HIGH (ref 0.00–0.07)
Basophils Absolute: 0 10*3/uL (ref 0.0–0.1)
Basophils Relative: 0 %
Eosinophils Absolute: 0 10*3/uL (ref 0.0–0.5)
Eosinophils Relative: 0 %
HCT: 34 % — ABNORMAL LOW (ref 39.0–52.0)
Hemoglobin: 11.2 g/dL — ABNORMAL LOW (ref 13.0–17.0)
Immature Granulocytes: 1 %
Lymphocytes Relative: 5 %
Lymphs Abs: 1.4 10*3/uL (ref 0.7–4.0)
MCH: 27.4 pg (ref 26.0–34.0)
MCHC: 32.9 g/dL (ref 30.0–36.0)
MCV: 83.1 fL (ref 80.0–100.0)
Monocytes Absolute: 1.5 10*3/uL — ABNORMAL HIGH (ref 0.1–1.0)
Monocytes Relative: 6 %
Neutro Abs: 24.1 10*3/uL — ABNORMAL HIGH (ref 1.7–7.7)
Neutrophils Relative %: 88 %
Platelets: 511 10*3/uL — ABNORMAL HIGH (ref 150–400)
RBC: 4.09 MIL/uL — ABNORMAL LOW (ref 4.22–5.81)
RDW: 16.2 % — ABNORMAL HIGH (ref 11.5–15.5)
WBC: 27.4 10*3/uL — ABNORMAL HIGH (ref 4.0–10.5)
nRBC: 0 % (ref 0.0–0.2)

## 2021-09-08 LAB — PHOSPHORUS: Phosphorus: 2 mg/dL — ABNORMAL LOW (ref 2.5–4.6)

## 2021-09-08 LAB — MAGNESIUM: Magnesium: 2 mg/dL (ref 1.7–2.4)

## 2021-09-08 LAB — GLUCOSE, CAPILLARY: Glucose-Capillary: 77 mg/dL (ref 70–99)

## 2021-09-08 MED ORDER — K PHOS MONO-SOD PHOS DI & MONO 155-852-130 MG PO TABS
500.0000 mg | ORAL_TABLET | Freq: Two times a day (BID) | ORAL | Status: AC
  Administered 2021-09-08 (×2): 500 mg via ORAL
  Filled 2021-09-08 (×2): qty 2

## 2021-09-08 MED ORDER — SODIUM CHLORIDE 0.9 % IV SOLN: INTRAVENOUS | Status: AC

## 2021-09-08 NOTE — Evaluation (Signed)
Occupational Therapy Evaluation Patient Details Name: Douglas Holt MRN: [id number] DOB: [birth year] Today's Date: 09/08/2021   History of Present Illness Douglas Holt is a 61 y.o. 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  Patient had rapid deterioration and his results on his imaging was concerning given that he had speech difficulties for about the last few weeks and a poor appetite.   Clinical Impression   Douglas Holt is a 61 year old man who presents with above medical history. On evaluation he demonstrates functional upper body ROM, strength and coordination. He is independent with ambulation and ADLs. He reports no deficits except for speech stating "there is nothing wrong with me." He had no overt LOB. He is very irritated by not being able to ambulate independently. From a therapy standpoint he demonstrates good balance and physical abilities. He has no OT needs and can ambulate without assistance.     Recommendations for follow up therapy are one component of a multi-disciplinary discharge planning process, led by the attending physician.  Recommendations may be updated based on patient status, additional functional criteria and insurance authorization.   Follow Up Recommendations  No OT follow up    Assistance Recommended at Discharge None  Patient can return home with the following      Functional Status Assessment  Patient has not had a recent decline in their functional status  Equipment Recommendations  None recommended by OT    Recommendations for Other Services       Precautions / Restrictions Precautions Precautions: None      Mobility Bed Mobility Overal  bed mobility: Independent                  Transfers Overall transfer level: Independent                        Balance Overall balance assessment: No apparent balance deficits (not formally assessed)                                         ADL either performed or assessed with clinical judgement   ADL Overall ADL's : Independent                                             Vision Patient Visual Report: No change from baseline       Perception     Praxis      Pertinent Vitals/Pain Pain Assessment Pain Assessment: No/denies pain     Hand Dominance Right   Extremity/Trunk Assessment Upper Extremity Assessment Upper Extremity Assessment: RUE deficits/detail;LUE deficits/detail RUE Deficits / Details: WFL ROM, 5/5 strength RUE Sensation: WNL RUE Coordination: WNL LUE Deficits / Details: WFL ROm, 5/5 strength LUE Sensation: WNL LUE Coordination: WNL   Lower Extremity Assessment Lower Extremity Assessment: Defer to PT evaluation   Cervical / Trunk Assessment Cervical / Trunk Assessment: Normal   Communication Communication Communication: Expressive difficulties   Cognition  Arousal/Alertness: Awake/alert Behavior During Therapy: WFL for tasks assessed/performed Overall Cognitive Status: Within Functional Limits for tasks assessed                                       General Comments       Exercises     Shoulder Instructions      Home Living Family/patient expects to be discharged to:: Private residence Living Arrangements: Alone Available Help at Discharge: Available PRN/intermittently;Family                         Home Equipment: None          Prior Functioning/Environment Prior Level of Function : Independent/Modified Independent                        OT Problem List:        OT Treatment/Interventions:      OT Goals(Current goals can be found in the  care plan section) Acute Rehab OT Goals OT Goal Formulation: All assessment and education complete, DC therapy  OT Frequency:      Co-evaluation              AM-PAC OT "6 Clicks" Daily Activity     Outcome Measure Help from another person eating meals?: None Help from another person taking care of personal grooming?: None Help from another person toileting, which includes using toliet, bedpan, or urinal?: None Help from another person bathing (including washing, rinsing, drying)?: None Help from another person to put on and taking off regular upper body clothing?: None Help from another person to put on and taking off regular lower body clothing?: None 6 Click Score: 24   End of Session Nurse Communication: Mobility status  Activity Tolerance: Patient tolerated treatment well Patient left: in chair  OT Visit Diagnosis: Other symptoms and signs involving the nervous system (R29.898)                Time: 1140-1206 OT Time Calculation (min): 26 min Charges:  OT General Charges $OT Visit: 1 Visit OT Evaluation $OT Eval Low Complexity: 1 Low  Zamiah Tollett, OTR/L [place]  Office [phone] Pager: [place] 09/08/2021, 1:04 PM

## 2021-09-08 NOTE — Progress Notes (Signed)
PROGRESS NOTE    Douglas Holt  PFX:[id number] DOB: [birth year] DOA: 09/06/2021 PCP: Maximiano Coss, NP   Brief Narrative:  Douglas Holt is a 61 y.o. 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  Patient had rapid deterioration and his results on his imaging was concerning given that he had speech difficulties for about the last few weeks and a poor appetite.  He was evaluated by the oncologist today for evaluation of a lung mass, neck adenopathy as well as brain metastasis.  It was recommended that he go to the ED yesterday however he refuses but was agreeable to coming to the oncology clinic today.  He had a CT of the chest and neck CT yesterday with findings of cavitary right upper lobe mass and probable right hilar medicine lymphadenopathy, bilateral adrenal nodules and a right neck adenopathy as well as necrotic lesions in the right paraspinal musculature, peripheral enhancing metastasis within the medial right cerebellar hemorrhage with surrounding edema.  He continues to have aphasia and difficulty speaking properly for last few weeks with garbled speech and patient's mother noticed that he also is having trouble using his right hand.  Patient's brother was concerned that he is losing weight and appetite appears poor and patient is having some head pressure.  At the urging of the oncologist he was sent to the hospital and given 10 mg of IV Decadron for further work-up and evaluation for his brain metastasis.  Interventional radiology was consulted for a node biopsy and repeat head CT scan within contrast was ordered given recommendations of the oncologist.  Patient was given IV Decadron 10 mg in the  clinic and will be placed on scheduled Decadron now.  Dr. Benay Spice has already consulted radiation oncology for further evaluation recommendations  He underwent biopsy and under went a head CT scan with and without contrast however he is refusing his MRI.  Brain stimulation has been put off for now and will be reassessed at the beginning of the week.  Assessment and Plan:  Cavitary right upper lobe mass with associated with right hilar mediastinal lymphadenopathy, right neck adenopathy as well as necrotic lesions in the right paraspinal musculature and peripherally enhancing metastasis in the medial right cerebellar hemisphere with associated edema -Recently had a CT scan 3 days ago and presents with neurological symptoms.  Admit to inpatient MedSurg -Brain metastasis were identified on neck CT scan so he will need a proper CT scan of the head with and without contrast and this was ordered but not done yet -Radiation oncology evaluated and wants to get a baseline MRI and see what they are treating and so they recommended MRI of the brain with and without contrast as well as an MRI of the cervical spine but the patient has refused MRIs at this time -Given this radiation oncology will defer his simulation today and reevaluate next week if patient is agreeable -Patient has palpable neck lymphadenopathy so interventional radiology has been consulted for a biopsy to confirm diagnosis and distinguish between likely non-small cell cancer versus small cell cancer; biopsies pending to be done -Continue supportive care and start IV fluid hydration with normal saline at 100 MLS and reduce to  75 mL/hr  -Check CBC, CMP, mag, Phos again in the AM  -Started on Decadron given his brain metastasis and likely surrounding edema causing his neurological symptoms and he is given 10 mg IV x1 in the clinic and will be started on scheduled Decadron 4 mg every 6 scheduled -We will need a diagnostic biopsy and initiation of  treatment depending on the biopsy results and radiation oncology has also been consulted -Pain Control with Oxycodone IR and Acetaminophen -Palliative care has been consulted as well as radiation oncology -Radiation oncology is consulted for brain stimulation and they have recommended a baseline MRI to evaluate with her treating and to monitor disease progression but patient refuses -He underwent biopsy of a lymph node from the right supraclavicular area and results are still pending -TOC has been assisted for disposition planning and PT OT are evaluating and recommending no follow-up   Expressive aphasia with right-sided weakness -Likely in the setting of his brain metastasis; symptoms have been persistent for the last few weeks or so if not longer -Get a head CT scan with and without contrast and it showed "6-7 metastatic lesions identified with associated edema and regional mass effect." -Reportedly had a poor p.o. appetite so we will get nutritionist consult and SLP evaluation -Started on IV dexamethasone; may need neurology consult -Radiation oncology has ordered MRIs of his brain and cervical spine but patient is refused this and other interventions -Per radiation oncology his CT of the neck is not an ideal study to truly know what they would be treating and when I have a baseline to compare for response to later -They feel a CT of the head is not as ideal but would be better than what they have currently still because of the patient refusing to obtain a MRI for baseline purposes patient simulation for radiation has been canceled currently -Still Refusing MRI at this time  -We will continue with steroids and trying to see if we will be for brain imaging at a later time -Palliative care has been consulted as well for possible discussion for  possible hospice at home if patient continues to refuse interventions   Tobacco abuse -Smoking cessation counseling given -Provide nicotine patch if  warranted and if patient has withdrawals   Leukocytosis -In the setting of steroid demargination  and WBC has gone from 11.5 -> 16.2 -> 15.0 -> 27.4  -Patient received IV dexamethasone 10 mg x 1 and is now on scheduled dexamethasone 4 mg every 6 scheduled -Continue to monitor for signs and symptoms of infection -Repeat CBC in a.m.  Normocytic Anemia -Likely dilutional drop in the setting of IV fluid hydration -Patient's hemoglobin/hematocrit went from 13.3/40.6 and is now trended down to 11.2/34.0 and is relatively stable for the last few days -Check anemia panel in a.m. -Continue monitor for signs and symptoms of bleeding; no overt bleeding noted -Repeat CBC in a.m.  Thrombocytosis -Patient's platelet count went from 425 and trended up to 511 -Continue to monitor and trend and repeat CBC in the a.m.   History of alcohol abuse -Placed on CIWA protocol and placed on thiamine, folic acid, multivitamin with minerals  Hypophosphatemia -Patient's Phos level is now 2.0 -Replete with p.o. K-Phos Neutral 500 mg p.o. twice daily x2 doses -Continue to monitor and replete as necessary Repeat Phos level in the a.m.   Hyponatremia on recent labs -Repeat labs here and continue IV fluid hydration and reduce rate to 75 mL/hr x12 hours  -Sodium was 128 is  now improving and is now 66 yesterday is now 132 -Continue to monitor and trend and repeat CMP in a.m.   Hypoalbuminemia -Mild patient's albumin went from 2.9 and trended down to  2.3 -See nutrition consult as below   Severe malnutrition in the context of chronic illness underweight -Nutrition was consulted and appreciate further evaluation recommendations -Nutrition Status: Nutrition Problem: Severe Malnutrition Etiology: chronic illness (new lung mass with brain mets) Signs/Symptoms: percent weight loss, mild fat depletion, severe muscle depletion, energy intake < or equal to 75% for > or equal to 1 month Interventions: Ensure Enlive  (each supplement provides 350kcal and 20 grams of protein), MVI  DVT prophylaxis: heparin injection 5,000 Units Start: 09/07/21 2200 SCDs Start: 09/06/21 1720    Code Status: DNR Family Communication: No family currently at bedside  Disposition Plan:  Level of care: Telemetry Status is: Inpatient Remains inpatient appropriate because: Needs further work-up and clearance by specialists    Consultants:  Interventional radiology Medical oncology Radiation oncology Palliative care medicine  Procedures:  Head CT with and without contrast LN Biopsy (Supraclavicular)  Antimicrobials:  Anti-infectives (From admission, onward)    None       Subjective: Seen and examined at bedside and his speech was a little bit better today.  Still refusing MRI.  Was ambulating fairly well.  No nausea or vomiting.  Denies any other concerns or complaints at this time  Objective: Vitals:   09/07/21 2308 09/08/21 0637 09/08/21 0656 09/08/21 1321  BP: 128/69 131/62  137/77  Pulse: 63 89  71  Resp: 16 20  16   Temp: (!) 97.5 F (36.4 C)   97.9 F (36.6 C)  TempSrc: Oral   Oral  SpO2: 100%   100%  Weight:   54.1 kg     Intake/Output Summary (Last 24 hours) at 09/08/2021 1625 Last data filed at 09/08/2021 1553 Gross per 24 hour  Intake 2953.64 ml  Output --  Net 2953.64 ml   Filed Weights   09/08/21 0656  Weight: 54.1 kg   Examination: Physical Exam:  Constitutional: Thin cachectic chronically ill-appearing Caucasian male currently no acute distress appears calm Respiratory: Diminished to auscultation bilaterally, no wheezing, rales, rhonchi or crackles. Normal respiratory effort and patient is not tachypenic. No accessory muscle use.  Unlabored breathing Cardiovascular: RRR, no murmurs / rubs / gallops. S1 and S2 auscultated. No extremity edema.  Abdomen: Soft, non-tender, non-distended. Bowel sounds positive.  GU: Deferred. Musculoskeletal: No clubbing / cyanosis of digits/nails. No  joint deformity upper and lower extremities.  Skin: No rashes, lesions, ulcers but has multiple tattoos scattered throughout his body Neurologic: Has some expressive aphasia which is improving.  Cranial nerves II through XII grossly intact otherwise Psychiatric: Normal judgment and insight. Alert and oriented x 3. Normal mood and appropriate affect.   Data Reviewed: I have personally reviewed following labs and imaging studies  CBC: Recent Labs  Lab 09/06/21 1707 09/07/21 0748 09/08/21 0622  WBC 16.2* 15.0* 27.4*  NEUTROABS  --  13.7* 24.1*  HGB 11.8* 11.1* 11.2*  HCT 35.8* 33.4* 34.0*  MCV 83.3 82.3 83.1  PLT 475* 458* 856*   Basic Metabolic Panel: Recent Labs  Lab 09/06/21 1707 09/07/21 0748 09/08/21 0622  NA 130* 133* 132*  K 3.7 3.7 3.5  CL 98 103 104  CO2 24 24 23   GLUCOSE 221* 131* 114*  BUN 10 10 17   CREATININE 0.74 0.56* 0.65  CALCIUM 7.9* 7.9* 8.0*  MG 2.1 2.1 2.0  PHOS 3.0 2.6 2.0*   GFR: Estimated Creatinine Clearance: 76.1 mL/min (by C-G formula based on SCr of 0.65 mg/dL). Liver Function Tests: Recent Labs  Lab 09/06/21 1707 09/07/21 0748 09/08/21 0622  AST 24 17 19   ALT 23 21 20   ALKPHOS 68 63 64  BILITOT 0.8 0.5 0.3  PROT 6.9 6.5 6.4*  ALBUMIN 2.5* 2.3* 2.3*   No results for input(s): LIPASE, AMYLASE in the last 168 hours. No results for input(s): AMMONIA in the last 168 hours. Coagulation Profile: Recent Labs  Lab 09/07/21 0748  INR 1.3*   Cardiac Enzymes: No results for input(s): CKTOTAL, CKMB, CKMBINDEX, TROPONINI in the last 168 hours. BNP (last 3 results) No results for input(s): PROBNP in the last 8760 hours. HbA1C: No results for input(s): HGBA1C in the last 72 hours. CBG: Recent Labs  Lab 09/07/21 0746 09/08/21 0736  GLUCAP 142* 77   Lipid Profile: No results for input(s): CHOL, HDL, LDLCALC, TRIG, CHOLHDL, LDLDIRECT in the last 72 hours. Thyroid Function Tests: No results for input(s): TSH, T4TOTAL, FREET4, T3FREE,  THYROIDAB in the last 72 hours. Anemia Panel: No results for input(s): VITAMINB12, FOLATE, FERRITIN, TIBC, IRON, RETICCTPCT in the last 72 hours. Sepsis Labs: No results for input(s): PROCALCITON, LATICACIDVEN in the last 168 hours.  No results found for this or any previous visit (from the past 240 hour(s)).   Radiology Studies: CT HEAD W & WO CONTRAST (5MM)  Result Date: 09/07/2021 CLINICAL DATA:  Brain metastases EXAM: CT HEAD WITHOUT AND WITH CONTRAST TECHNIQUE: Contiguous axial images were obtained from the base of the skull through the vertex without and with intravenous contrast. RADIATION DOSE REDUCTION: This exam was performed according to the departmental dose-optimization program which includes automated exposure control, adjustment of the mA and/or kV according to patient size and/or use of iterative reconstruction technique. CONTRAST:  29mL OMNIPAQUE IOHEXOL 300 MG/ML  SOLN COMPARISON:  None Available. FINDINGS: Brain: Several brain metastases are identified demonstrating hyperdensity peripherally and central necrosis. Hyperdensity probably reflects intralesional hemorrhage. Largest supratentorially measures up to 2.3 cm in the left frontal lobe. Infratentorially, there is a 3.3 cm lesion in the paramedian right cerebellum. There are 6 lesions identified. Possible seventh lesion of the lateral right cerebellum on series 5, image 56. There is associated edema and regional mass effect. No midline shift. Partial effacement of the fourth and left lateral ventricles. No hydrocephalus. Gray-white differentiation is preserved. There is no extra-axial fluid collection. No hydrocephalus. Vascular: There is atherosclerotic calcification at the skull base. Skull: Calvarium is unremarkable. Sinuses/Orbits: No acute finding. Other: None. IMPRESSION: 6-7 metastatic lesions identified with associated edema and regional mass effect. Electronically Signed   By: Macy Mis M.D.   On: 09/07/2021 15:02   [place]  CORE BIOPSY (LYMPH NODES)  Result Date: 09/07/2021 INDICATION: 61 year old with right lung mass, cervical lymphadenopathy and intracranial lesions. Findings are suggestive for metastatic disease. Tissue diagnosis is needed. EXAM: ULTRASOUND-GUIDED RIGHT SUPRACLAVICULAR LYMPH NODE BIOPSY MEDICATIONS: None. ANESTHESIA/SEDATION: Moderate (conscious) sedation was employed during this procedure. A total of Versed 2.0 mg and Fentanyl 100 mcg was administered intravenously by the radiology nurse. Total intra-service moderate Sedation Time: 10 minutes. The patient's level of consciousness and vital signs were monitored continuously by radiology nursing throughout the procedure under my direct supervision. FLUOROSCOPY TIME:  None COMPLICATIONS: None immediate. PROCEDURE: Informed consent was obtained from the patient's brother because the patient is not able to give consent due to altered mental status. A timeout was performed prior to  the initiation of the procedure. Right side of the neck was evaluated with ultrasound. An abnormal right supraclavicular lymph node was targeted for biopsy. Right side of the neck was prepped with chlorhexidine and sterile field was created. Skin was anesthetized using 1% lidocaine. A small incision was made. Using ultrasound guidance, an 18 gauge core biopsy needle was directed into the lymph node. Total of 5 core biopsies were obtained. Specimens placed in saline. Bandage placed over the puncture site. FINDINGS: Several small hypoechoic lymph nodes along the right side of the neck compatible with metastatic lymphadenopathy. A superficial lymph node in the right supraclavicular region was biopsied. Adequate specimens obtained. IMPRESSION: Ultrasound-guided core biopsy of a right supraclavicular lymph node. Electronically Signed   By: Markus Daft M.D.   On: 09/07/2021 14:52     Scheduled Meds:  dexamethasone (DECADRON) injection  4 mg Intravenous Q6H   docusate sodium  100 mg Oral BID    feeding supplement  237 mL Oral TID BM   folic acid  1 mg Oral Daily   heparin  5,000 Units Subcutaneous Q8H   multivitamin with minerals  1 tablet Oral Daily   phosphorus  500 mg Oral BID   thiamine  100 mg Oral Daily   Or   thiamine  100 mg Intravenous Daily   Continuous Infusions:  sodium chloride 100 mL/hr at 09/08/21 1333    LOS: 2 days   Raiford Noble, DO [facility] Available via Epic secure chat 7am-7pm After these hours, please refer to coverage provider listed on [email or web address] 09/08/2021, 4:25 PM

## 2021-09-08 NOTE — Evaluation (Signed)
Physical Therapy One Time Evaluation Patient Details Name: Douglas Holt MRN: [id number] DOB: Sep 26, 1960 Today's Date: 09/08/2021  History of Present Illness  Douglas Holt is a 61 y.o. 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  Pt admitted 09/06/21 for Cavitary right upper lobe mass with associated with right hilar mediastinal lymphadenopathy, right neck adenopathy as well as necrotic lesions in the right paraspinal musculature and peripherally enhancing metastasis in the medial right cerebellar hemisphere with associated edema as well as expressive aphasia with right sided weakness.  Clinical Impression  Patient evaluated by Physical Therapy with no further acute PT needs identified. All education has been completed and the patient has no further questions.  Pt ambulated in hallway and no unsteadiness observed.  Pt denies sensation changes and reports no weakness.  Pt wishes to be able to move freely around his room and walk.  See below for any follow-up Physical Therapy or equipment needs. PT is signing off. Thank you for this referral.      Recommendations for follow up therapy are one component of a multi-disciplinary discharge planning process, led by the attending physician.  Recommendations may be updated based on patient status, additional functional criteria and insurance authorization.  Follow Up Recommendations No PT follow up    Assistance Recommended at Discharge    Patient can return home with the following       Equipment Recommendations None recommended by PT  Recommendations for Other Services       Functional Status Assessment Patient has not had a recent decline in their  functional status     Precautions / Restrictions Precautions Precautions: None      Mobility  Bed Mobility Overal bed mobility: Independent                  Transfers Overall transfer level: Independent                      Ambulation/Gait Ambulation/Gait assistance: Modified independent (Device/Increase time) Gait Distance (Feet): 400 Feet Assistive device: None Gait Pattern/deviations: WFL(Within Functional Limits)          Stairs            Wheelchair Mobility    Modified Rankin (Stroke Patients Only)       Balance Overall balance assessment: No apparent balance deficits (not formally assessed) (pt reports he only falls with alchohol use)                                           Pertinent Vitals/Pain Pain Assessment Pain Assessment: No/denies pain    Home Living Family/patient expects to be discharged to:: Private residence Living Arrangements: Alone Available Help at Discharge: Available PRN/intermittently;Family Type of Home: House         Home Layout: One level Home Equipment: None      Prior Function Prior Level of Function : Independent/Modified Independent                     Hand Dominance   Dominant Hand: Right  Extremity/Trunk Assessment   Upper Extremity Assessment Upper Extremity Assessment: RUE deficits/detail;LUE deficits/detail RUE Deficits / Details: WFL ROM, 5/5 strength RUE Sensation: WNL RUE Coordination: WNL LUE Deficits / Details: WFL ROm, 5/5 strength LUE Sensation: WNL LUE Coordination: WNL    Lower Extremity Assessment Lower Extremity Assessment: Overall WFL for tasks assessed    Cervical / Trunk Assessment Cervical / Trunk Assessment: Normal  Communication   Communication: Expressive difficulties  Cognition Arousal/Alertness: Awake/alert Behavior During Therapy: WFL for tasks assessed/performed Overall Cognitive Status: Within Functional Limits for tasks  assessed                                          General Comments      Exercises     Assessment/Plan    PT Assessment Patient does not need any further PT services  PT Problem List         PT Treatment Interventions      PT Goals (Current goals can be found in the Care Plan section)  Acute Rehab PT Goals PT Goal Formulation: All assessment and education complete, DC therapy    Frequency       Co-evaluation               AM-PAC PT "6 Clicks" Mobility  Outcome Measure Help needed turning from your back to your side while in a flat bed without using bedrails?: None Help needed moving from lying on your back to sitting on the side of a flat bed without using bedrails?: None Help needed moving to and from a bed to a chair (including a wheelchair)?: None Help needed standing up from a chair using your arms (e.g., wheelchair or bedside chair)?: A Little Help needed to walk in hospital room?: A Little Help needed climbing 3-5 steps with a railing? : A Little 6 Click Score: 21    End of Session   Activity Tolerance: Patient tolerated treatment well Patient left: in bed;with call bell/phone within reach Nurse Communication: Mobility status PT Visit Diagnosis: Difficulty in walking, not elsewhere classified (R26.2)    Time: 5053-9767 PT Time Calculation (min) (ACUTE ONLY): 15 min   Charges:   PT Evaluation $PT Eval Low Complexity: 1 Low         Kati PT, DPT Acute Rehabilitation Services Pager: [phone] Office: [place] 09/08/2021, 1:34 PM

## 2021-09-09 LAB — COMPREHENSIVE METABOLIC PANEL
ALT: 19 U/L (ref 0–44)
AST: 16 U/L (ref 15–41)
Albumin: 2.2 g/dL — ABNORMAL LOW (ref 3.5–5.0)
Alkaline Phosphatase: 55 U/L (ref 38–126)
Anion gap: 4 — ABNORMAL LOW (ref 5–15)
BUN: 15 mg/dL (ref 6–20)
CO2: 27 mmol/L (ref 22–32)
Calcium: 7.9 mg/dL — ABNORMAL LOW (ref 8.9–10.3)
Chloride: 104 mmol/L (ref 98–111)
Creatinine, Ser: 0.56 mg/dL — ABNORMAL LOW (ref 0.61–1.24)
GFR, Estimated: >60 mL/min (ref >60)
Glucose, Bld: 121 mg/dL — ABNORMAL HIGH (ref 70–99)
Potassium: 3.6 mmol/L (ref 3.5–5.1)
Sodium: 135 mmol/L (ref 135–145)
Total Bilirubin: 0.3 mg/dL (ref 0.3–1.2)
Total Protein: 5.9 g/dL — ABNORMAL LOW (ref 6.5–8.1)

## 2021-09-09 LAB — CBC WITH DIFFERENTIAL/PLATELET
Abs Immature Granulocytes: 0.18 10*3/uL — ABNORMAL HIGH (ref 0.00–0.07)
Basophils Absolute: 0 10*3/uL (ref 0.0–0.1)
Basophils Relative: 0 %
Eosinophils Absolute: 0 10*3/uL (ref 0.0–0.5)
Eosinophils Relative: 0 %
HCT: 33 % — ABNORMAL LOW (ref 39.0–52.0)
Hemoglobin: 10.8 g/dL — ABNORMAL LOW (ref 13.0–17.0)
Immature Granulocytes: 1 %
Lymphocytes Relative: 6 %
Lymphs Abs: 1.4 10*3/uL (ref 0.7–4.0)
MCH: 27.2 pg (ref 26.0–34.0)
MCHC: 32.7 g/dL (ref 30.0–36.0)
MCV: 83.1 fL (ref 80.0–100.0)
Monocytes Absolute: 1.2 10*3/uL — ABNORMAL HIGH (ref 0.1–1.0)
Monocytes Relative: 6 %
Neutro Abs: 19.3 10*3/uL — ABNORMAL HIGH (ref 1.7–7.7)
Neutrophils Relative %: 87 %
Platelets: 511 10*3/uL — ABNORMAL HIGH (ref 150–400)
RBC: 3.97 MIL/uL — ABNORMAL LOW (ref 4.22–5.81)
RDW: 16.5 % — ABNORMAL HIGH (ref 11.5–15.5)
WBC: 22.1 10*3/uL — ABNORMAL HIGH (ref 4.0–10.5)
nRBC: 0 % (ref 0.0–0.2)

## 2021-09-09 LAB — GLUCOSE, CAPILLARY: Glucose-Capillary: 109 mg/dL — ABNORMAL HIGH (ref 70–99)

## 2021-09-09 LAB — MAGNESIUM: Magnesium: 1.9 mg/dL (ref 1.7–2.4)

## 2021-09-09 LAB — PHOSPHORUS: Phosphorus: 2.2 mg/dL — ABNORMAL LOW (ref 2.5–4.6)

## 2021-09-09 MED ORDER — K PHOS MONO-SOD PHOS DI & MONO 155-852-130 MG PO TABS
500.0000 mg | ORAL_TABLET | Freq: Two times a day (BID) | ORAL | Status: AC
Start: 2021-09-09 — End: 2021-09-09
  Administered 2021-09-09 (×2): 500 mg via ORAL
  Filled 2021-09-09 (×2): qty 2

## 2021-09-09 NOTE — Progress Notes (Signed)
PROGRESS NOTE    Douglas Holt  SAY:[id number] DOB: 04/08/1961 DOA: 09/06/2021 PCP: Maximiano Coss, NP   Brief Narrative:  Douglas Holt is a 61 y.o. 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  Patient had rapid deterioration and his results on his imaging was concerning given that he had speech difficulties for about the last few weeks and a poor appetite.  He was evaluated by the oncologist today for evaluation of a lung mass, neck adenopathy as well as brain metastasis.  It was recommended that he go to the ED yesterday however he refuses but was agreeable to coming to the oncology clinic today.  He had a CT of the chest and neck CT yesterday with findings of cavitary right upper lobe mass and probable right hilar medicine lymphadenopathy, bilateral adrenal nodules and a right neck adenopathy as well as necrotic lesions in the right paraspinal musculature, peripheral enhancing metastasis within the medial right cerebellar hemorrhage with surrounding edema.  He continues to have aphasia and difficulty speaking properly for last few weeks with garbled speech and patient's mother noticed that he also is having trouble using his right hand.  Patient's brother was concerned that he is losing weight and appetite appears poor and patient is having some head pressure.  At the urging of the oncologist he was sent to the hospital and given 10 mg of IV Decadron for further work-up and evaluation for his brain metastasis.  Interventional radiology was consulted for a node biopsy and repeat head CT scan within contrast was ordered given recommendations of the oncologist.  Patient was given IV Decadron 10 mg in the  clinic and will be placed on scheduled Decadron now.  Dr. Benay Spice has already consulted radiation oncology for further evaluation recommendations   He underwent biopsy and under went a head CT scan with and without contrast however he is refusing his MRI.  Brain stimulation has been put off for now and will be reassessed at the beginning of the week.  Assessment and Plan:  Cavitary right upper lobe mass with associated with right hilar mediastinal lymphadenopathy, right neck adenopathy as well as necrotic lesions in the right paraspinal musculature and peripherally enhancing metastasis in the medial right cerebellar hemisphere with associated edema -Recently had a CT scan 3 days ago and presents with neurological symptoms.  Admit to inpatient MedSurg -Brain metastasis were identified on neck CT scan so he will need a proper CT scan of the head with and without contrast and this was ordered but not done yet -Radiation oncology evaluated and wants to get a baseline MRI and see what they are treating and so they recommended MRI of the brain with and without contrast as well as an MRI of the cervical spine but the patient has refused MRIs at this time -Given this radiation oncology will defer his simulation today and reevaluate next week if patient is agreeable -Patient has palpable neck lymphadenopathy so interventional radiology has been consulted for a biopsy to confirm diagnosis and distinguish between likely non-small cell cancer versus small cell cancer; biopsies pending to be done -Continue supportive care and start IV fluid hydration with normal saline at 100 MLS and reduce  to 75 mL/hr  -Check CBC, CMP, mag, Phos again in the AM  -Started on Decadron given his brain metastasis and likely surrounding edema causing his neurological symptoms and he is given 10 mg IV x1 in the clinic and will be started on scheduled Decadron 4 mg every 6 scheduled -We will need a diagnostic biopsy and initiation of  treatment depending on the biopsy results and radiation oncology has also been consulted -Pain Control with Oxycodone IR and Acetaminophen -Palliative care has been consulted as well as radiation oncology -Radiation oncology is consulted for brain stimulation and they have recommended a baseline MRI to evaluate with her treating and to monitor disease progression but patient refuses -He underwent biopsy of a lymph node from the right supraclavicular area and results are still pending -TOC has been assisted for disposition planning and PT OT are evaluating and recommending no follow-up   Expressive aphasia with right-sided weakness, improving  -Likely in the setting of his brain metastasis; symptoms have been persistent for the last few weeks or so if not longer -Get a head CT scan with and without contrast and it showed "6-7 metastatic lesions identified with associated edema and regional mass effect." -Reportedly had a poor p.o. appetite so we will get nutritionist consult and SLP evaluation -Started on IV dexamethasone; may need neurology consult -Radiation oncology has ordered MRIs of his brain and cervical spine but patient is refused this and other interventions -Per radiation oncology his CT of the neck is not an ideal study to truly know what they would be treating and when I have a baseline to compare for response to later -They feel a CT of the head is not as ideal but would be better than what they have currently still because of the patient refusing to obtain a MRI for baseline purposes patient simulation for radiation has been canceled currently -Still Refusing MRI at this time  -We will continue with steroids and trying to see if we will be for brain imaging at a later time -Palliative care has been consulted as well for possible discussion for  possible hospice at home if patient continues to refuse interventions; he is now DNR/DNI and is able to speak but still has some dysarthria  and he is invested in plan for disease modifying therapy and wants to pursue other what ever interventions are available to him based on biopsy results -Patient still does not want to MRI and reports having MRIs in the past she had a severe anxiety panic attack   Tobacco Abuse -Smoking cessation counseling given -Provide nicotine patch if warranted and if patient has withdrawals   Leukocytosis -In the setting of steroid demargination  and WBC has gone from 11.5 -> 16.2 -> 15.0 -> 27.4 -> 22.1 -Patient received IV dexamethasone 10 mg x 1 and is now on scheduled dexamethasone 4 mg every 6 scheduled -Continue to monitor for signs and symptoms of infection -Repeat CBC in a.m.   Normocytic Anemia -Likely dilutional drop in the setting of IV fluid hydration -Patient's hemoglobin/hematocrit went from 13.3/40.6 -> 11.2/34.0 -> 10.8/33.0 and is relatively stable for the last few days -Check anemia panel in a.m. -Continue monitor for signs and symptoms of bleeding; no overt bleeding noted -Repeat CBC in a.m.   Thrombocytosis -Patient's platelet count went from 425 and trended up to 511 x2 -Continue to monitor and trend and repeat CBC in the a.m.   History of alcohol abuse -Placed on CIWA protocol and placed on thiamine,  folic acid, multivitamin with minerals   Hypophosphatemia -Patient's Phos level is now 2.2 -Replete with p.o. K-Phos Neutral 500 mg p.o. twice daily x2 doses -Continue to monitor and replete as necessary Repeat Phos level in the a.m.   Hyponatremia  -Repeat labs here and continue IV fluid hydration and reduce rate to 75 mL/hr x12 hours  -Sodium was 128 -> 133 -> 132 -> 135 -Continue to monitor and trend and repeat CMP in a.m.   Hypoalbuminemia -Mild patient's albumin went from 2.9 ->  2.3 -> 2.2 -See nutrition consult as below   Severe malnutrition in the context of chronic illness underweight -Nutrition was consulted and appreciate further evaluation  recommendations -Nutrition Status: Nutrition Problem: Severe Malnutrition Etiology: chronic illness (new lung mass with brain mets) Signs/Symptoms: percent weight loss, mild fat depletion, severe muscle depletion, energy intake < or equal to 75% for > or equal to 1 month Interventions: Ensure Enlive (each supplement provides 350kcal and 20 grams of protein), MVI    DVT prophylaxis: heparin injection 5,000 Units Start: 09/07/21 2200 SCDs Start: 09/06/21 1720    Code Status: DNR Family Communication: No family currently at bedside  Disposition Plan:  Level of care: Telemetry Status is: Inpatient Remains inpatient appropriate because: Needs further discussion with radiation oncology about starting radiation treatments now that his head CT is done   Consultants:  Medical oncology Radiation oncology Interventional radiology Palliative care medicine  Procedures:  Head CT with and without contrast LN Biopsy (Supraclavicular)  Antimicrobials:  Anti-infectives (From admission, onward)    None       Subjective: Seen and examined at bedside and his speech is continuing to improve.  Still refusing an MRI.  Ambulating well.  Denies any nausea or vomiting but was little agitated about the hospital down in his telemetry monitoring.  Telemetry monitoring has not been discontinued.  Denies any other concerns or complaints at this time.  Objective: Vitals:   09/08/21 1843 09/08/21 2014 09/09/21 0611 09/09/21 1511  BP:  (!) 141/79 137/80   Pulse:  71 (!) 57   Resp:   16   Temp:  97.7 F (36.5 C) 98.1 F (36.7 C)   TempSrc:  Oral Oral   SpO2:  100% 100%   Weight:    60.6 kg  Height: 5' 9.5" (1.765 m)       Intake/Output Summary (Last 24 hours) at 09/09/2021 1631 Last data filed at 09/08/2021 2317 Gross per 24 hour  Intake 1050.95 ml  Output --  Net 1050.95 ml   Filed Weights   09/08/21 0656 09/09/21 1511  Weight: 54.1 kg 60.6 kg   Examination: Physical  Exam:  Constitutional: Thin cachectic chronically ill-appearing Caucasian male, in no acute distress appears little agitated though Respiratory: Diminished to auscultation bilaterally with coarse breath sounds, no wheezing, rales, rhonchi or crackles. Normal respiratory effort and patient is not tachypenic. No accessory muscle use.  Unlabored breathing Cardiovascular: RRR, no murmurs / rubs / gallops. S1 and S2 auscultated. No extremity edema.  Abdomen: Soft, non-tender, non-distended.  Bowel sounds positive.  GU: Deferred. Musculoskeletal: No clubbing / cyanosis of digits/nails. No joint deformity upper and lower extremities.  Skin: No rashes, lesions, ulcers on limited skin evaluation but does have multiple tattoos scattered throughout his body Neurologic: CN 2-12 grossly intact with no focal deficits but he does have expressive aphasia which is significantly improving. Psychiatric: Normal judgment and insight. Alert and oriented x 3. Normal mood and appropriate affect.   Data Reviewed:  I have personally reviewed following labs and imaging studies  CBC: Recent Labs  Lab 09/06/21 1707 09/07/21 0748 09/08/21 0622 09/09/21 0546  WBC 16.2* 15.0* 27.4* 22.1*  NEUTROABS  --  13.7* 24.1* 19.3*  HGB 11.8* 11.1* 11.2* 10.8*  HCT 35.8* 33.4* 34.0* 33.0*  MCV 83.3 82.3 83.1 83.1  PLT 475* 458* 511* 142*   Basic Metabolic Panel: Recent Labs  Lab 09/06/21 1707 09/07/21 0748 09/08/21 0622 09/09/21 0546  NA 130* 133* 132* 135  K 3.7 3.7 3.5 3.6  CL 98 103 104 104  CO2 24 24 23 27   GLUCOSE 221* 131* 114* 121*  BUN 10 10 17 15   CREATININE 0.74 0.56* 0.65 0.56*  CALCIUM 7.9* 7.9* 8.0* 7.9*  MG 2.1 2.1 2.0 1.9  PHOS 3.0 2.6 2.0* 2.2*   GFR: Estimated Creatinine Clearance: 85.2 mL/min (A) (by C-G formula based on SCr of 0.56 mg/dL (L)). Liver Function Tests: Recent Labs  Lab 09/06/21 1707 09/07/21 0748 09/08/21 0622 09/09/21 0546  AST 24 17 19 16   ALT 23 21 20 19   ALKPHOS 68 63  64 55  BILITOT 0.8 0.5 0.3 0.3  PROT 6.9 6.5 6.4* 5.9*  ALBUMIN 2.5* 2.3* 2.3* 2.2*   No results for input(s): LIPASE, AMYLASE in the last 168 hours. No results for input(s): AMMONIA in the last 168 hours. Coagulation Profile: Recent Labs  Lab 09/07/21 0748  INR 1.3*   Cardiac Enzymes: No results for input(s): CKTOTAL, CKMB, CKMBINDEX, TROPONINI in the last 168 hours. BNP (last 3 results) No results for input(s): PROBNP in the last 8760 hours. HbA1C: No results for input(s): HGBA1C in the last 72 hours. CBG: Recent Labs  Lab 09/07/21 0746 09/08/21 0736 09/09/21 0756  GLUCAP 142* 77 109*   Lipid Profile: No results for input(s): CHOL, HDL, LDLCALC, TRIG, CHOLHDL, LDLDIRECT in the last 72 hours. Thyroid Function Tests: No results for input(s): TSH, T4TOTAL, FREET4, T3FREE, THYROIDAB in the last 72 hours. Anemia Panel: No results for input(s): VITAMINB12, FOLATE, FERRITIN, TIBC, IRON, RETICCTPCT in the last 72 hours. Sepsis Labs: No results for input(s): PROCALCITON, LATICACIDVEN in the last 168 hours.  No results found for this or any previous visit (from the past 240 hour(s)).   Radiology Studies: No results found.   Scheduled Meds:  dexamethasone (DECADRON) injection  4 mg Intravenous Q6H   docusate sodium  100 mg Oral BID   feeding supplement  237 mL Oral TID BM   folic acid  1 mg Oral Daily   heparin  5,000 Units Subcutaneous Q8H   multivitamin with minerals  1 tablet Oral Daily   phosphorus  500 mg Oral BID   thiamine  100 mg Oral Daily   Or   thiamine  100 mg Intravenous Daily   Continuous Infusions:   LOS: 3 days   Raiford Noble, DO [facility] Available via Epic secure chat 7am-7pm After these hours, please refer to coverage provider listed on [email or web address] 09/09/2021, 4:31 PM

## 2021-09-09 NOTE — Consult Note (Signed)
Consultation Note Date: 09/09/2021   Patient Name: Douglas Holt  DOB: 14-Apr-1960  MRN: [id number]  Age / Sex: 61 y.o., male  PCP: Maximiano Coss, NP Referring Physician: Kerney Elbe, DO  Reason for Consultation: Establishing goals of care  HPI/Patient Profile: 61 y.o. male  with past medical history of rheumatoid arthritis, history of tobacco abuse who was recently diagnosed in outpatient setting with likely metastatic cancer to brain from a lung primary admitted on 09/06/2021 with rapid deterioration and was sent to the hospital following oncology evaluation.  He has had increased aphasia with garbled speech over the past few weeks and some difficulty using his right hand.  Appetite has been poor and he has been losing weight.  He has been given Decadron and was evaluated by radiation oncology.  Recommendation was for an MRI but he has been declining MRI due to anxiety.  He underwent biopsy and also had CT of head but MRI would be preferable imaging.  Palliative consulted for goals of care.  Clinical Assessment and Goals of Care: I met today with Douglas Holt.  He was awake and alert and was able to participate somewhat in conversation, however, there were times where it was limited secondary to his dysarthria and poor concentration.  I introduced palliative care as specialized medical care for people living with serious illness. It focuses on providing relief from the symptoms and stress of a serious illness. The goal is to improve quality of life for both the patient and the family.  We discussed his clinical course and he knows that he has likely cancer with brain metastasis.  We discussed that this is not a curable illness and he expressed understanding.  We discussed options for care moving forward including aggressive care plan with further pursuit of disease modifying therapy such as radiation,  chemotherapy, targeted therapy, or immunotherapy versus focusing on aggressive symptom management alone with a goal of improving his quality of life.  He expressed understanding and reports that he wants to know results of biopsy and is invested in plan to pursue disease modifying therapy.  In talking with staff, it appears that he has become much more agreeable and understanding of situation since initiation of steroid therapy.   SUMMARY OF RECOMMENDATIONS   -Agree with DNR/DNI -Reported to be improving in regard to mental status and speech since starting steroids.  He is able to speak with me today though he does have some dysarthria.  He is invested in plan for disease modifying therapy and wants to pursue what ever interventions are available to him based upon results of biopsy.  He continues to decline MRI as he reports having an MRI in the past during which she had acute anxiety/panic attack and he does not want to do this again. -Palliative care to continue to follow.  Code Status/Advance Care Planning: DNR  Symptom Management:  Confusion/dysarthria: Improving on Decadron  Palliative Prophylaxis:  Delirium Protocol  Additional Recommendations (Limitations, Scope, Preferences): Full Scope Treatment Prognosis:  Unable to determine  Discharge Planning: To Be Determined      Primary Diagnoses: Present on Admission:  Malignant neoplasm metastatic to brain [facility])   I have reviewed the medical record, interviewed the patient and family, and examined the patient. The following aspects are pertinent.  Past Medical History:  Diagnosis Date   Back pain    collasped disc   Social History   Socioeconomic History   Marital status: Single    Spouse name: Not on file   Number of children: Not on file   Years of education: Not on file   Highest education level: Not on file  Occupational History    Comment: disability for back pain  Tobacco Use   Smoking status: Every Day     Packs/day: 2.00    Years: 40.00    Pack years: 80.00    Types: Cigarettes   Smokeless tobacco: Not on file  Vaping Use   Vaping Use: Never used  Substance and Sexual Activity   Alcohol use: Yes    Alcohol/week: 28.0 standard drinks    Types: 28 Cans of beer per week    Comment: 3-4 beers a day    Drug use: No   Sexual activity: Not Currently  Other Topics Concern   Not on file  Social History Narrative   Not on file   Social Determinants of Health   Financial Resource Strain: Not on file  Food Insecurity: Not on file  Transportation Needs: Not on file  Physical Activity: Not on file  Stress: Not on file  Social Connections: Not on file   History reviewed. No pertinent family history. Scheduled Meds:  dexamethasone (DECADRON) injection  4 mg Intravenous Q6H   docusate sodium  100 mg Oral BID   feeding supplement  237 mL Oral TID BM   folic acid  1 mg Oral Daily   heparin  5,000 Units Subcutaneous Q8H   multivitamin with minerals  1 tablet Oral Daily   phosphorus  500 mg Oral BID   thiamine  100 mg Oral Daily   Or   thiamine  100 mg Intravenous Daily   Continuous Infusions: PRN Meds:.acetaminophen **OR** acetaminophen, hydrALAZINE, LORazepam **OR** LORazepam, LORazepam, ondansetron **OR** ondansetron (ZOFRAN) IV, oxyCODONE, senna-docusate Medications Prior to Admission:  Prior to Admission medications   Medication Sig Start Date End Date Taking? Authorizing Provider  aspirin 325 MG tablet Take 325 mg by mouth every 6 (six) hours as needed for moderate pain. For pain   Yes [provider]  Tetrahydrozoline HCl (VISINE OP) Apply 1-2 drops to eye daily as needed (red eyes).   Yes [provider]  doxycycline (VIBRA-TABS) 100 MG tablet Take 1 tablet (100 mg total) by mouth 2 (two) times daily. Patient not taking: Reported on 09/06/2021 08/15/21   Maximiano Coss, NP  predniSONE (STERAPRED UNI-PAK 21 TAB) 10 MG (21) TBPK tablet Take per package instructions.  Do not skip doses. Finish entire supply. Patient not taking: Reported on 09/06/2021 08/15/21   Maximiano Coss, NP   No Known Allergies Review of Systems Denies pain or shortness of breath  Physical Exam General: Alert, awake, in no acute distress.  Thin and frail HEENT: No bruits, no goiter, no JVD Heart: Regular rate and rhythm. No murmur appreciated. Lungs: Diminished air movement Abdomen: Soft, nontender, nondistended, positive bowel sounds.   Ext: No significant edema Skin: Warm and dry Neuro: Expressive aphasia  Vital Signs: BP 137/80 (BP Location: Right Arm)   Pulse (!) 57   Temp  98.1 F (36.7 C) (Oral)   Resp 16   Ht 5' 9.5" (1.765 m)   Wt 54.1 kg   SpO2 100%   BMI 17.36 kg/m  Pain Scale: 0-10   Pain Score: 0-No pain   SpO2: SpO2: 100 % O2 Device:SpO2: 100 % O2 Flow Rate: .O2 Flow Rate (L/min): 2 L/min  IO: Intake/output summary:  Intake/Output Summary (Last 24 hours) at 09/09/2021 1125 Last data filed at 09/08/2021 2317 Gross per 24 hour  Intake 3367.59 ml  Output --  Net 3367.59 ml    LBM: Last BM Date : 09/08/21 Baseline Weight: Weight: 54.1 kg Most recent weight: Weight: 54.1 kg     Palliative Assessment/Data:   Flowsheet Rows    Flowsheet Row Most Recent Value  Intake Tab   Referral Department Hospitalist  Unit at Time of Referral Oncology Unit  Palliative Care Primary Diagnosis Cancer  Date Notified 09/07/21  Palliative Care Type New Palliative care  Reason for referral Clarify Goals of Care  Date of Admission 09/06/21  Date first seen by Palliative Care 09/08/21  # of days Palliative referral response time 1 Day(s)  # of days IP prior to Palliative referral 1  Clinical Assessment   Palliative Performance Scale Score 50%  Psychosocial & Spiritual Assessment   Palliative Care Outcomes   Patient/Family meeting held? Yes  Who was at the meeting? Patient       Time In: 1100 Time Out: 1200 Time Total: 60 Greater than 50%  of this time  was spent counseling and coordinating care related to the above assessment and plan.  Signed by: Micheline Rough, MD   Please contact Palliative Medicine Team phone at [phone] for questions and concerns.  For individual provider: See Shea Evans

## 2021-09-10 ENCOUNTER — Ambulatory Visit
Admission: RE | Admit: 2021-09-10 | Discharge: 2021-09-10 | Disposition: A | Discharge status: Home / Self Care | Payer: PPO | Source: Ambulatory Visit | Attending: Radiation Oncology | Admitting: Radiation Oncology

## 2021-09-10 DIAGNOSIS — C7931 Secondary malignant neoplasm of brain: Secondary | ICD-10-CM | POA: Diagnosis not present

## 2021-09-10 DIAGNOSIS — Z510 Encounter for antineoplastic radiation therapy: Secondary | ICD-10-CM | POA: Insufficient documentation

## 2021-09-10 DIAGNOSIS — C3411 Malignant neoplasm of upper lobe, right bronchus or lung: Secondary | ICD-10-CM | POA: Insufficient documentation

## 2021-09-10 LAB — CBC WITH DIFFERENTIAL/PLATELET
Abs Immature Granulocytes: 0.23 10*3/uL — ABNORMAL HIGH (ref 0.00–0.07)
Basophils Absolute: 0 10*3/uL (ref 0.0–0.1)
Basophils Relative: 0 %
Eosinophils Absolute: 0 10*3/uL (ref 0.0–0.5)
Eosinophils Relative: 0 %
HCT: 34.4 % — ABNORMAL LOW (ref 39.0–52.0)
Hemoglobin: 11.4 g/dL — ABNORMAL LOW (ref 13.0–17.0)
Immature Granulocytes: 1 %
Lymphocytes Relative: 8 %
Lymphs Abs: 1.7 10*3/uL (ref 0.7–4.0)
MCH: 27.5 pg (ref 26.0–34.0)
MCHC: 33.1 g/dL (ref 30.0–36.0)
MCV: 83.1 fL (ref 80.0–100.0)
Monocytes Absolute: 1.6 10*3/uL — ABNORMAL HIGH (ref 0.1–1.0)
Monocytes Relative: 8 %
Neutro Abs: 17.6 10*3/uL — ABNORMAL HIGH (ref 1.7–7.7)
Neutrophils Relative %: 83 %
Platelets: 535 10*3/uL — ABNORMAL HIGH (ref 150–400)
RBC: 4.14 MIL/uL — ABNORMAL LOW (ref 4.22–5.81)
RDW: 16.4 % — ABNORMAL HIGH (ref 11.5–15.5)
WBC: 21.2 10*3/uL — ABNORMAL HIGH (ref 4.0–10.5)
nRBC: 0 % (ref 0.0–0.2)

## 2021-09-10 LAB — COMPREHENSIVE METABOLIC PANEL
ALT: 21 U/L (ref 0–44)
AST: 19 U/L (ref 15–41)
Albumin: 2.5 g/dL — ABNORMAL LOW (ref 3.5–5.0)
Alkaline Phosphatase: 57 U/L (ref 38–126)
Anion gap: 6 (ref 5–15)
BUN: 17 mg/dL (ref 6–20)
CO2: 29 mmol/L (ref 22–32)
Calcium: 8.1 mg/dL — ABNORMAL LOW (ref 8.9–10.3)
Chloride: 99 mmol/L (ref 98–111)
Creatinine, Ser: 0.64 mg/dL (ref 0.61–1.24)
GFR, Estimated: >60 mL/min (ref >60)
Glucose, Bld: 127 mg/dL — ABNORMAL HIGH (ref 70–99)
Potassium: 3.8 mmol/L (ref 3.5–5.1)
Sodium: 134 mmol/L — ABNORMAL LOW (ref 135–145)
Total Bilirubin: 0.4 mg/dL (ref 0.3–1.2)
Total Protein: 6.5 g/dL (ref 6.5–8.1)

## 2021-09-10 LAB — GLUCOSE, CAPILLARY: Glucose-Capillary: 117 mg/dL — ABNORMAL HIGH (ref 70–99)

## 2021-09-10 LAB — PHOSPHORUS: Phosphorus: 3 mg/dL (ref 2.5–4.6)

## 2021-09-10 LAB — MAGNESIUM: Magnesium: 2.2 mg/dL (ref 1.7–2.4)

## 2021-09-10 MED ORDER — DEXAMETHASONE 4 MG PO TABS
4.0000 mg | ORAL_TABLET | Freq: Two times a day (BID) | ORAL | Status: DC
  Administered 2021-09-10 – 2021-09-12 (×5): 4 mg via ORAL
  Filled 2021-09-10 (×5): qty 1

## 2021-09-10 NOTE — Progress Notes (Signed)
Daily Progress Note   Patient Name: Douglas Holt       Date: 09/10/2021 DOB: 04/09/1960  Age: 61 y.o. MRN#: [id number] Attending Physician: Kerney Elbe, DO Primary Care Physician: Maximiano Coss, NP Admit Date: 09/06/2021  Reason for Consultation/Follow-up: Establishing goals of care  Subjective: I saw and examined [name] today.  He was sitting up in his bedside chair at time of my encounter.  I also saw him independently ambulating down the hallway throughout the day today.  He still has some expressive aphasia but indicates to me that he remains invested in plan for radiation and wants to know results of biopsy and potential options for other disease modifying therapy.  He had CT scan completed and indicates to me today that he is willing to do other procedures, however, he thinks that an MRI would be "too much" for him to handle.  He denies any pain, shortness of breath, nausea, or other symptoms.  Discussed that our team would follow peripherally moving forward but left my card and he will let Douglas Holt know if there are other particular questions which can be of assistance.  Length of Stay: 4  Current Medications: Scheduled Meds:   dexamethasone  4 mg Oral Q12H   docusate sodium  100 mg Oral BID   feeding supplement  237 mL Oral TID BM   folic acid  1 mg Oral Daily   heparin  5,000 Units Subcutaneous Q8H   multivitamin with minerals  1 tablet Oral Daily   thiamine  100 mg Oral Daily   Or   thiamine  100 mg Intravenous Daily    Continuous Infusions:   PRN Meds: acetaminophen **OR** acetaminophen, hydrALAZINE, LORazepam, ondansetron **OR** ondansetron (ZOFRAN) IV, oxyCODONE, senna-docusate  Physical Exam      General: Alert, awake, in no acute distress.  Thin and  frail HEENT: No bruits, no goiter, no JVD Heart: Regular rate and rhythm. No murmur appreciated. Lungs: Diminished air movement Abdomen: Soft, nontender, nondistended, positive bowel sounds.   Ext: No significant edema Skin: Warm and dry Neuro: Expressive aphasia  Vital Signs: BP 135/86 (BP Location: Left Arm)   Pulse (!) 59   Temp 97.8 F (36.6 C) (Oral)   Resp 18   Ht 5' 9.5" (1.765 m)   Wt 63.8 kg  SpO2 100%   BMI 20.47 kg/m  SpO2: SpO2: 100 % O2 Device: O2 Device: Room Air O2 Flow Rate: O2 Flow Rate (L/min): 2 L/min  Intake/output summary:  Intake/Output Summary (Last 24 hours) at 09/10/2021 0843 Last data filed at 09/09/2021 1602 Gross per 24 hour  Intake 720 ml  Output --  Net 720 ml   LBM: Last BM Date : 09/09/21 Baseline Weight: Weight: 54.1 kg Most recent weight: Weight: 63.8 kg       Palliative Assessment/Data:    Flowsheet Rows    Flowsheet Row Most Recent Value  Intake Tab   Referral Department Hospitalist  Unit at Time of Referral Oncology Unit  Palliative Care Primary Diagnosis Cancer  Date Notified 09/07/21  Palliative Care Type New Palliative care  Reason for referral Clarify Goals of Care  Date of Admission 09/06/21  Date first seen by Palliative Care 09/08/21  # of days Palliative referral response time 1 Day(s)  # of days IP prior to Palliative referral 1  Clinical Assessment   Palliative Performance Scale Score 50%  Psychosocial & Spiritual Assessment   Palliative Care Outcomes   Patient/Family meeting held? Yes  Who was at the meeting? Patient       Patient Active Problem List   Diagnosis Date Noted   Protein-calorie malnutrition, severe 09/07/2021   Malignant neoplasm metastatic to brain [facility]) 09/06/2021   Mass of right lung 09/06/2021   Expressive aphasia 09/06/2021   Tobacco abuse 09/06/2021    Palliative Care Assessment & Plan   Patient Profile: 61 y.o. male  with past medical history of rheumatoid arthritis, history of  tobacco abuse who was recently diagnosed in outpatient setting with likely metastatic cancer to brain from a lung primary admitted on 09/06/2021 with rapid deterioration and was sent to the hospital following oncology evaluation.  He has had increased aphasia with garbled speech over the past few weeks and some difficulty using his right hand.  Appetite has been poor and he has been losing weight.  He has been given Decadron and was evaluated by radiation oncology.  Recommendation was for an MRI but he has been declining MRI due to anxiety.  He underwent biopsy and also had CT of head but MRI would be preferable imaging.  Palliative consulted for goals of care.  Recommendations/Plan: Agree with DNR/DNI Mental status continues to improve.  He does still have some expressive aphasia and gets frustrated at times when trying to speak.  He understands that he has metastatic disease with brain involvement and specifically asked about starting radiation.  We also discussed that we are waiting for biopsy results to return to know if he is a candidate and may benefit from further disease modifying therapy such as systemic therapy, immunotherapy, or targeted therapy. He reports good control of symptoms and denies any other needs today. We will not continue to follow daily, however, I will ask another member of the palliative care team to check in later this week if he remains admitted.  Please call if there are specific needs with which we can be of assistance.  Code Status:    Code Status Orders  (From admission, onward)           Start     Ordered   09/06/21 1721  Do not attempt resuscitation (DNR)  Continuous       Question Answer Comment  In the event of cardiac or respiratory ARREST Do not call a "code blue"   In the  event of cardiac or respiratory ARREST Do not perform Intubation, CPR, defibrillation or ACLS   In the event of cardiac or respiratory ARREST Use medication by any route, position, wound  care, and other measures to relive pain and suffering. May use oxygen, suction and manual treatment of airway obstruction as needed for comfort.      09/06/21 1721           Code Status History     This patient has a current code status but no historical code status.       Prognosis:  Unable to determine  Discharge Planning: To Be Determined  Care plan was discussed with patient  Thank you for allowing the Palliative Medicine Team to assist in the care of this patient.  Total time: 35 minutes  Micheline Rough, MD  Please contact Palliative Medicine Team phone at [phone] for questions and concerns.

## 2021-09-10 NOTE — Progress Notes (Signed)
The patient's CT scan of the head is noted with 6-7 lesions.  Given this and the limitations of CT imaging, the patient appears to be a reasonable candidate for whole brain radiation treatment.  We will discuss this with the patient and potentially proceed with simulation in the near future for treatment planning.  Additionally, the patient may benefit from palliative radiation treatment elsewhere such as the lymphadenopathy and paraspinal disease, and we can discuss this further with him as well.  ------------------------------------------------  Jodelle Gross, MD, PhD

## 2021-09-10 NOTE — Progress Notes (Addendum)
HEMATOLOGY-ONCOLOGY PROGRESS NOTE  ASSESSMENT AND PLAN: Right lung mass, neck adenopathy, brain metastases Rheumatoid arthritis Expressive aphasia, right-sided weakness, and altered mental status-likely secondary to brain metastases History of tobacco and alcohol use Hyponatremia on a chemistry panel 08/15/2021  Mr. Kimmey appears improved.  He underwent a biopsy of a supraclavicular lymph node on 09/07/2021.  Pathology results are pending.  Likely has metastatic lung cancer but will await final pathology results before making treatment decisions.  He was also found to have brain metastases.  He has been started on dexamethasone with resolution of his headaches and improvement of his expressive aphasia.  Will change IV dexamethasone to p.o.  Radiation oncology consult currently pending.  He has been evaluated by PT and OT and no follow-up services are recommended.  The patient has indicated that he would like to go home.  Currently lives by himself.  We have recommended that he stay with a family member but he is insistent on him back to his home.  TOC referral was placed on 6/2.  Recommendations: 1.  Whole brain radiation per radiation oncology, I would hold on treating the neck until final pathology results are available 2.  DC IV dexamethasone.  Begin dexamethasone 4 mg p.o. twice daily.  Taper per radiation oncology. 3.  We will follow-up on lymph node biopsy results. 4.  TOC referral was placed on 09/07/2021 for disposition planning.  Outpatient follow-up will be scheduled at the cancer center.   Mikey Bussing Mr. Galvan was interviewed and examined.  His clinical status appears improved.  He has persistent dysarthria and expressive aphasia, but this has improved significantly compared to hospital admission.  The brain CT reveals multifocal metastatic disease.  He is being scheduled for whole brain radiation.  We will follow-up on the lymph node biopsy pathology and recommend systemic  treatment options.  He indicated he wishes to proceed with treatment of the metastatic cancer as opposed to comfort care.  We discussed the likely diagnosis of lung cancer, small cell versus non-small cell.  He needs further evaluation by the transition of care team/social work to determine disposition to home with family versus skilled nursing facility placement.  I was present for greater than 50% of today's visit.  I performed medical decision making. SUBJECTIVE: Mr. Quesinberry is sitting up and eating breakfast this morning.  Overall, he reports feeling better.  He is not having any headaches.  Expressive aphasia improved.  Wants to go home.  Mikey Bussing, NP   PHYSICAL EXAMINATION:  Vitals:   09/09/21 2200 09/10/21 0517  BP: (!) 136/91 135/86  Pulse: 77 (!) 59  Resp: 18 18  Temp: 97.8 F (36.6 C) 97.8 F (36.6 C)  SpO2: 99% 100%   Filed Weights   09/09/21 1511 09/10/21 0500 09/10/21 0716  Weight: 60.6 kg 60.5 kg 63.8 kg    Intake/Output from previous day: 06/04 0701 - 06/05 0700 In: 720 [P.O.:720] Out: -   Physical Exam Vitals reviewed.  HENT:     Head: Normocephalic.  Eyes:     General: No scleral icterus. Cardiovascular:     Rate and Rhythm: Normal rate and regular rhythm.  Pulmonary:     Comments: Diminished breath sounds on the right. Abdominal:     Palpations: Abdomen is soft.     Tenderness: There is no abdominal tenderness.  Lymphadenopathy:     Comments: Palpable right cervical and supraclavicular adenopathy.  Skin:    General: Skin is warm and dry.  Neurological:  Mental Status: He is alert.     Comments: Expressive aphasia improved    LABORATORY DATA:  I have reviewed the data as listed    Latest Ref Rng & Units 09/10/2021    5:12 AM 09/09/2021    5:46 AM 09/08/2021    6:22 AM  CMP  Glucose 70 - 99 mg/dL 127   121   114    BUN 6 - 20 mg/dL 17   15   17     Creatinine 0.61 - 1.24 mg/dL 0.64   0.56   0.65    Sodium 135 - 145 mmol/L 134   135   132     Potassium 3.5 - 5.1 mmol/L 3.8   3.6   3.5    Chloride 98 - 111 mmol/L 99   104   104    CO2 22 - 32 mmol/L 29   27   23     Calcium 8.9 - 10.3 mg/dL 8.1   7.9   8.0    Total Protein 6.5 - 8.1 g/dL 6.5   5.9   6.4    Total Bilirubin 0.3 - 1.2 mg/dL 0.4   0.3   0.3    Alkaline Phos 38 - 126 U/L 57   55   64    AST 15 - 41 U/L 19   16   19     ALT 0 - 44 U/L 21   19   20       Lab Results  Component Value Date   WBC 21.2 (H) 09/10/2021   HGB 11.4 (L) 09/10/2021   HCT 34.4 (L) 09/10/2021   MCV 83.1 09/10/2021   PLT 535 (H) 09/10/2021   NEUTROABS 17.6 (H) 09/10/2021    No results found for: CEA1, CEA, QQI297, CA125, PSA1  CT HEAD W & WO CONTRAST (5MM)  Result Date: 09/07/2021 CLINICAL DATA:  Brain metastases EXAM: CT HEAD WITHOUT AND WITH CONTRAST TECHNIQUE: Contiguous axial images were obtained from the base of the skull through the vertex without and with intravenous contrast. RADIATION DOSE REDUCTION: This exam was performed according to the departmental dose-optimization program which includes automated exposure control, adjustment of the mA and/or kV according to patient size and/or use of iterative reconstruction technique. CONTRAST:  22mL OMNIPAQUE IOHEXOL 300 MG/ML  SOLN COMPARISON:  None Available. FINDINGS: Brain: Several brain metastases are identified demonstrating hyperdensity peripherally and central necrosis. Hyperdensity probably reflects intralesional hemorrhage. Largest supratentorially measures up to 2.3 cm in the left frontal lobe. Infratentorially, there is a 3.3 cm lesion in the paramedian right cerebellum. There are 6 lesions identified. Possible seventh lesion of the lateral right cerebellum on series 5, image 56. There is associated edema and regional mass effect. No midline shift. Partial effacement of the fourth and left lateral ventricles. No hydrocephalus. Gray-white differentiation is preserved. There is no extra-axial fluid collection. No hydrocephalus. Vascular:  There is atherosclerotic calcification at the skull base. Skull: Calvarium is unremarkable. Sinuses/Orbits: No acute finding. Other: None. IMPRESSION: 6-7 metastatic lesions identified with associated edema and regional mass effect. Electronically Signed   By: Macy Mis M.D.   On: 09/07/2021 15:02   CT Soft Tissue Neck W Contrast  Result Date: 09/05/2021 CLINICAL DATA:  Provided history: Neck mass. Neck mass, nonpulsatile. Additional history provided by scanning technologist: Patient reports difficulty speaking, knots on right side of neck for 1-2 months. 120 pack-year current smoker. EXAM: CT NECK WITH CONTRAST TECHNIQUE: Multidetector CT imaging of the neck was performed using  the standard protocol following the bolus administration of intravenous contrast. RADIATION DOSE REDUCTION: This exam was performed according to the departmental dose-optimization program which includes automated exposure control, adjustment of the mA and/or kV according to patient size and/or use of iterative reconstruction technique. CONTRAST:  8mL ISOVUE-300 IOPAMIDOL (ISOVUE-300) INJECTION 61% COMPARISON:  None Available. FINDINGS: Pharynx and larynx: No appreciable swelling or mass within the oral cavity, pharynx or larynx. Postinflammatory calcifications/tonsilloliths within the right palatine tonsil. Salivary glands: Atrophic appearance of the bilateral parotid glands. Unremarkable appearance of the submandibular glands. Thyroid: Unremarkable. Lymph nodes: 1.6 x 1.1 cm centrally cystic/necrotic and peripherally hyperenhancing right level 2/3 lymph node deep to the right-sided skin surface marker (series 2, image 62) (series 12, image 22). Numerous enlarged lymph nodes within the right lower neck, right retroclavicular region, partially imaged right axilla and partially imaged mediastinum. For instance, an enlarged right retroclavicular lymph node measures 2.5 x 1.5 cm in transaxial dimensions (series 10, image 60). Some of  these lymph nodes are hyperenhancing and centrally cystic/necrotic change. The partially imaged mediastinal lymphadenopathy is extensive. Vascular: The major vascular structures of the neck are patent. Atherosclerotic plaque within the visualized aortic arch, proximal major branch vessels of the neck and carotid arteries. Limited intracranial: 2.4 x 2.0 cm enhancing metastasis within the left temporal lobe with prominent surrounding edema (series 2, image 2). 2.6 x 2.5 cm peripherally enhancing metastasis within the medial right cerebellar hemisphere with surrounding edema (series 2, image 27). Visualized orbits: The orbits are largely excluded from the field of view. Mastoids and visualized paranasal sinuses: No significant paranasal sinus disease or mastoid effusion at the imaged levels. Skeleton: No acute bony abnormality or aggressive osseous lesion identified. C2-C3 grade 1 retrolisthesis. Cervical spondylosis with multilevel disc space narrowing, disc bulges/central disc protrusion, endplate spurring, uncovertebral hypertrophy and facet arthrosis. Disc space narrowing is greatest at C5-C6 and C6-C7 (advanced at these levels). Multilevel spinal canal stenosis. Most notably, a broad-based central disc protrusion contributes to at least moderate spinal canal stenosis at C5-C6. Multilevel bony neural foraminal narrowing. Upper chest: Please refer to the concurrently performed and separately reported chest CT for a description of intrathoracic findings, including but not limited to a large right upper lobe lung mass and extensive mediastinal lymphadenopathy. Other: 1.5 x 1.9 x 1.6 cm peripherally enhancing, centrally cystic/necrotic lesion within the paraspinal musculature at midline and to the right at the C2-C3 level (series 2, image 52). 2.1 x 2.4 x 1.8 cm peripherally enhancing and centrally cystic/necrotic lesion within the right paraspinal musculature at the T2-T3 level (series 2, image 92). These lesions  likely reflect tumor deposits. Impressions 1,2,3,4 will be called to the ordering clinician or representative by the Radiologist Assistant, and communication documented in the PACS or [employer]. IMPRESSION: 1. 1.6 x 1.1 cm hyperenhancing and centrally cystic/necrotic right level 2/3 lymph node located immediately deep to the right-sided skin surface marker. This is consistent with nodal metastatic disease. Prominent metastatic lymphadenopathy is also present within the right lower neck, right retroclavicular region, partially imaged right axilla and partially imaged mediastinum. 2. Peripherally enhancing and centrally cystic/necrotic lesions in the right paraspinal musculature at C2-C3 and T2-T3, as described. These lesions likely reflect metastatic tumor deposits. 3. 2.4 x 2.0 cm enhancing intracranial metastasis within the left temporal lobe with prominent surrounding edema. 2.6 x 2.5 cm peripherally enhancing metastasis within the medial right cerebellar hemisphere with surrounding edema. A brain MRI without and with contrast is recommended for further evaluation. 4. Please  refer to the concurrently performed and separately reported chest CT for a description of intrathoracic findings, including but not limited to a large right upper lobe lung mass. 5. Atherosclerotic plaque within the visualized aortic arch, proximal major branch vessels of the neck and carotid arteries. Aortic Atherosclerosis (ICD10-I70.0). 6. Cervical spondylosis, as described. Electronically Signed   By: Kellie Simmering D.O.   On: 09/05/2021 11:20   CT CHEST LUNG CA SCREEN LOW DOSE W/O CM  Result Date: 09/06/2021 CLINICAL DATA:  61 year old male current smoker with 120 pack-year history of smoking. Lung cancer screening examination. EXAM: CT CHEST WITHOUT CONTRAST LOW-DOSE FOR LUNG CANCER SCREENING TECHNIQUE: Multidetector CT imaging of the chest was performed following the standard protocol without IV contrast. RADIATION DOSE  REDUCTION: This exam was performed according to the departmental dose-optimization program which includes automated exposure control, adjustment of the mA and/or kV according to patient size and/or use of iterative reconstruction technique. COMPARISON:  No priors. FINDINGS: Cardiovascular: Heart size is normal. There is no significant pericardial fluid, thickening or pericardial calcification. There is aortic atherosclerosis, as well as atherosclerosis of the great vessels of the mediastinum and the coronary arteries, including calcified atherosclerotic plaque in the left anterior descending and right coronary arteries. Mild calcifications of the mitral annulus. Mediastinum/Nodes: Bulky subcarinal lymphadenopathy measuring up to 2.2 cm in short axis. Fullness in the right hilar region poorly evaluated on today's noncontrast CT examination, but suspicious for right hilar lymphadenopathy. Bulky low right paratracheal lymph node measuring 2.2 cm in short axis. Other high right paratracheal lymphadenopathy measuring up to 1.8 cm in short axis in the superior mediastinum. Esophagus is unremarkable in appearance. No axillary lymphadenopathy. Lungs/Pleura: Large macrolobulated partially cavitary spiculated right upper lobe mass (axial image 70) with a volume derived mean diameter of 6.2 cm, highly concerning for primary bronchogenic carcinoma such as a squamous cell carcinoma. Several other smaller pulmonary nodules are also noted. No acute consolidative airspace disease. No pleural effusions. Mild diffuse bronchial wall thickening with mild centrilobular and paraseptal emphysema. Upper Abdomen: Aortic atherosclerosis. 2.2 x 1.2 cm intermediate attenuation (29 HU) right adrenal nodule. 3.1 x 2.4 cm intermediate attenuation (37 HU) left adrenal mass. Musculoskeletal: There are no aggressive appearing lytic or blastic lesions noted in the visualized portions of the skeleton. IMPRESSION: 1. Highly aggressive appearing  partially cavitary right upper lobe mass which almost certainly represents a primary squamous cell carcinoma of the lung, categorized as Lung-RADS 4XS, highly suspicious. This is associated with probable right hilar and mediastinal lymphadenopathy, as well as bilateral adrenal nodules which may represent metastatic lesions. Additional imaging evaluation or consultation with Pulmonology or Thoracic Surgery recommended. 2. The "S" modifier above refers to potentially clinically significant non lung cancer related findings. Specifically, there is aortic atherosclerosis, in addition to two-vessel coronary artery disease. Please note that although the presence of coronary artery calcium documents the presence of coronary artery disease, the severity of this disease and any potential stenosis cannot be assessed on this non-gated CT examination. Assessment for potential risk factor modification, dietary therapy or pharmacologic therapy may be warranted, if clinically indicated. 3. Mild diffuse bronchial wall thickening with mild centrilobular and paraseptal emphysema; imaging findings suggestive of underlying COPD. 4. There are calcifications of the mitral annulus. Echocardiographic correlation for evaluation of potential valvular dysfunction may be warranted if clinically indicated. These results will be called to the ordering clinician or representative by the Radiologist Assistant, and communication documented in the PACS or [employer]. Aortic Atherosclerosis (ICD10-I70.0) and  Emphysema (ICD10-J43.9). Electronically Signed   By: Vinnie Langton M.D.   On: 09/06/2021 06:36   [place] CORE BIOPSY (LYMPH NODES)  Result Date: 09/07/2021 INDICATION: 61 year old with right lung mass, cervical lymphadenopathy and intracranial lesions. Findings are suggestive for metastatic disease. Tissue diagnosis is needed. EXAM: ULTRASOUND-GUIDED RIGHT SUPRACLAVICULAR LYMPH NODE BIOPSY MEDICATIONS: None. ANESTHESIA/SEDATION: Moderate  (conscious) sedation was employed during this procedure. A total of Versed 2.0 mg and Fentanyl 100 mcg was administered intravenously by the radiology nurse. Total intra-service moderate Sedation Time: 10 minutes. The patient's level of consciousness and vital signs were monitored continuously by radiology nursing throughout the procedure under my direct supervision. FLUOROSCOPY TIME:  None COMPLICATIONS: None immediate. PROCEDURE: Informed consent was obtained from the patient's brother because the patient is not able to give consent due to altered mental status. A timeout was performed prior to the initiation of the procedure. Right side of the neck was evaluated with ultrasound. An abnormal right supraclavicular lymph node was targeted for biopsy. Right side of the neck was prepped with chlorhexidine and sterile field was created. Skin was anesthetized using 1% lidocaine. A small incision was made. Using ultrasound guidance, an 18 gauge core biopsy needle was directed into the lymph node. Total of 5 core biopsies were obtained. Specimens placed in saline. Bandage placed over the puncture site. FINDINGS: Several small hypoechoic lymph nodes along the right side of the neck compatible with metastatic lymphadenopathy. A superficial lymph node in the right supraclavicular region was biopsied. Adequate specimens obtained. IMPRESSION: Ultrasound-guided core biopsy of a right supraclavicular lymph node. Electronically Signed   By: Markus Daft M.D.   On: 09/07/2021 14:52     No future appointments.     LOS: 4 days

## 2021-09-10 NOTE — Progress Notes (Signed)
PROGRESS NOTE    Douglas Holt  GEX:[id number] DOB: [birth year] DOA: 09/06/2021 PCP: Maximiano Coss, NP   Brief Narrative:  Douglas Holt is a 61 y.o. 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  Patient had rapid deterioration and his results on his imaging was concerning given that he had speech difficulties for about the last few weeks and a poor appetite.  He was evaluated by the oncologist today for evaluation of a lung mass, neck adenopathy as well as brain metastasis.  It was recommended that he go to the ED yesterday however he refuses but was agreeable to coming to the oncology clinic today.  He had a CT of the chest and neck CT yesterday with findings of cavitary right upper lobe mass and probable right hilar medicine lymphadenopathy, bilateral adrenal nodules and a right neck adenopathy as well as necrotic lesions in the right paraspinal musculature, peripheral enhancing metastasis within the medial right cerebellar hemorrhage with surrounding edema.  He continues to have aphasia and difficulty speaking properly for last few weeks with garbled speech and patient's mother noticed that he also is having trouble using his right hand.  Patient's brother was concerned that he is losing weight and appetite appears poor and patient is having some head pressure.  At the urging of the oncologist he was sent to the hospital and given 10 mg of IV Decadron for further work-up and evaluation for his brain metastasis.  Interventional radiology was consulted for a node biopsy and repeat head CT scan within contrast was ordered given recommendations of the oncologist.  Patient was given IV Decadron 10 mg in the  clinic and will be placed on scheduled Decadron now.  Dr. Benay Spice has already consulted radiation oncology for further evaluation recommendations   He underwent biopsy and under went a head CT scan with and without contrast however he is refusing his MRI.  Rad Onc evaluated and will initiate Radiation.  Assessment and Plan:  Cavitary right upper lobe mass with associated with right hilar mediastinal lymphadenopathy, right neck adenopathy as well as necrotic lesions in the right paraspinal musculature and peripherally enhancing metastasis in the medial right cerebellar hemisphere with associated edema -Recently had a CT scan 3 days ago and presents with neurological symptoms.  Admit to inpatient MedSurg -Brain metastasis were identified on neck CT scan so he will need a proper CT scan of the head with and without contrast and this was ordered but not done yet -Radiation oncology evaluated and wants to get a baseline MRI and see what they are treating and so they recommended MRI of the brain with and without contrast as well as an MRI of the cervical spine but the patient has refused MRIs at this time -Given this radiation oncology will defer his simulation today and reevaluate next week if patient is agreeable -Patient has palpable neck lymphadenopathy so interventional radiology has been consulted for a biopsy to confirm diagnosis and distinguish between likely non-small cell cancer versus small cell cancer; biopsies pending to be done -Continue supportive care and now IVF now stopped -Check CBC, CMP, mag, Phos again in the AM  -Started on Decadron given his brain metastasis and likely  surrounding edema causing his neurological symptoms and he is given 10 mg IV x1 in the clinic and will be started on scheduled Decadron 4 mg every 6 scheduled and now discontinued and being changed to 4 mg po BID and taper per Rad Onc -We will need a diagnostic biopsy and initiation of treatment depending on the biopsy  results and radiation oncology has also been consulted -Pain Control with Oxycodone IR and Acetaminophen -Palliative care has been consulted as well as radiation oncology -Radiation oncology is consulted for brain stimulation and they have recommended a baseline MRI to evaluate with her treating and to monitor disease progression but patient refuses the MRI but after review of the CT Scan and improvement Dr. Lisbeth Renshaw is in agreement for whole brain radiation and possibly palliative as well to the Neck/Spine -He underwent biopsy of a lymph node from the right supraclavicular area and results are still pending -TOC has been assisted for disposition planning and PT OT are evaluating and recommending no follow-up   Expressive aphasia with right-sided weakness, improving  -Likely in the setting of his brain metastasis; symptoms have been persistent for the last few weeks or so if not longer -Get a head CT scan with and without contrast and it showed "6-7 metastatic lesions identified with associated edema and regional mass effect." -Reportedly had a poor p.o. appetite so we will get nutritionist consult and SLP evaluation -Started on IV dexamethasone; may need neurology consult -Radiation oncology has ordered MRIs of his brain and cervical spine but patient is refused this and other interventions -Per radiation oncology his CT of the neck is not an ideal study to truly know what they would be treating and when I have a baseline to compare for response to later -They feel a CT of the head is not as ideal but would be better than what they have currently still because of the patient refusing to obtain a MRI for baseline purposes patient simulation for radiation has been canceled currently -Still Refusing MRI at this time  -We will continue with steroids and trying to see if we will be for brain imaging at a later time -Palliative care has been consulted as well for possible discussion for  possible hospice  at home if patient continues to refuse interventions; he is now DNR/DNI and is able to speak but still has some dysarthria and he is invested in plan for disease modifying therapy and wants to pursue other what ever interventions are available to him based on biopsy results -Patient still does not want to MRI and reports having MRIs in the past she had a severe anxiety panic attack   Tobacco Abuse -Smoking cessation counseling given -Provide nicotine patch if warranted and if patient has withdrawals   Leukocytosis -In the setting of steroid demargination  and WBC has gone from 11.5 -> 16.2 -> 15.0 -> 27.4 -> 22.1 -> 21.2  -Patient received IV dexamethasone 10 mg x 1 and is now on scheduled dexamethasone 4 mg every 6 scheduled but is being changed to po 4 mg BID  -Continue to monitor for signs and symptoms of infection -Repeat CBC in a.m.   Normocytic Anemia -Likely dilutional drop in the setting of IV fluid hydration -Patient's hemoglobin/hematocrit went from 13.3/40.6 -> 11.2/34.0 -> 10.8/33.0 -> 11.4/34.4 and is relatively stable for the last few days -Check anemia panel in a.m. -Continue monitor for signs and symptoms of bleeding; no overt bleeding noted -Repeat CBC in a.m.   Thrombocytosis -Patient's  platelet count went from 425 and trended up to 511 x2 -> 535 -Continue to monitor and trend and repeat CBC in the a.m.   History of alcohol abuse -Placed on CIWA protocol and placed on thiamine, folic acid, multivitamin with minerals   Hypophosphatemia -Patient's Phos level is now 3.0 -Continue to monitor and replete as necessary Repeat Phos level in the a.m.   Hyponatremia  -Repeat labs here and continue IV fluid hydration and reduce rate to 75 mL/hr x12 hours  -Sodium was 128 -> 133 -> 132 -> 135 -> 134 -Continue to monitor and trend and repeat CMP in a.m.   Hypoalbuminemia -Mild patient's albumin went from 2.9 ->  2.3 -> 2.2 -> 2.5 -See nutrition consult as below   Severe  malnutrition in the context of chronic illness underweight -Nutrition was consulted and appreciate further evaluation recommendations -Nutrition Status: Nutrition Problem: Severe Malnutrition Etiology: chronic illness (new lung mass with brain mets) Signs/Symptoms: percent weight loss, mild fat depletion, severe muscle depletion, energy intake < or equal to 75% for > or equal to 1 month Interventions: Ensure Enlive (each supplement provides 350kcal and 20 grams of protein), MVI   DVT prophylaxis: heparin injection 5,000 Units Start: 09/07/21 2200 SCDs Start: 09/06/21 1720    Code Status: DNR Family Communication: Discussed with brother at bedside   Disposition Plan:  Level of care: Med-Surg Status is: Inpatient Remains inpatient appropriate because: Needs Radiation and further evalation by Rad Onc   Consultants:  IR Medical Oncology Radiation Oncology Palliative Care Medicine   Procedures:  Head CT with and without contrast LN Biopsy (Supraclavicular) Radiation  Antimicrobials:  Anti-infectives (From admission, onward)    None       Subjective: Seen and examined at bedside and he is doing much better and much more coherent.  Want to go for radiation later today.  Denies chest pain or shortness of breath no other concerns or complaints this time  Objective: Vitals:   09/09/21 2200 09/10/21 0500 09/10/21 0517 09/10/21 0716  BP: (!) 136/91  135/86   Pulse: 77  (!) 59   Resp: 18  18   Temp: 97.8 F (36.6 C)  97.8 F (36.6 C)   TempSrc: Oral  Oral   SpO2: 99%  100%   Weight:  60.5 kg  63.8 kg  Height:        Intake/Output Summary (Last 24 hours) at 09/10/2021 1201 Last data filed at 09/09/2021 1602 Gross per 24 hour  Intake 480 ml  Output --  Net 480 ml   Filed Weights   09/09/21 1511 09/10/21 0500 09/10/21 0716  Weight: 60.6 kg 60.5 kg 63.8 kg   Examination: Physical Exam:  Constitutional: Thin cachectic chronically ill-appearing Caucasian male currently  no acute distress Respiratory: Diminished to auscultation bilaterally with coarse breath sounds, no wheezing, rales, rhonchi or crackles. Normal respiratory effort and patient is not tachypenic. No accessory muscle use.  Unlabored breathing Cardiovascular: RRR, no murmurs / rubs / gallops. S1 and S2 auscultated. No extremity edema.   Abdomen: Soft, non-tender, non-distended. Bowel sounds positive.  GU: Deferred. Musculoskeletal: No clubbing / cyanosis of digits/nails. No joint deformity upper and lower extremities.  Skin: No rashes, lesions, ulcers but has multiple tattoos scattered throughout his body. No induration; Warm and dry.  Neurologic: CN 2-12 grossly intact with no focal deficits and his expressive aphasia is improved. Romberg sign and cerebellar reflexes not assessed.  Psychiatric: Normal judgment and insight. Alert and oriented x 3. Normal  mood and appropriate affect.   Data Reviewed: I have personally reviewed following labs and imaging studies  CBC: Recent Labs  Lab 09/06/21 1707 09/07/21 0748 09/08/21 0622 09/09/21 0546 09/10/21 0512  WBC 16.2* 15.0* 27.4* 22.1* 21.2*  NEUTROABS  --  13.7* 24.1* 19.3* 17.6*  HGB 11.8* 11.1* 11.2* 10.8* 11.4*  HCT 35.8* 33.4* 34.0* 33.0* 34.4*  MCV 83.3 82.3 83.1 83.1 83.1  PLT 475* 458* 511* 511* 681*   Basic Metabolic Panel: Recent Labs  Lab 09/06/21 1707 09/07/21 0748 09/08/21 0622 09/09/21 0546 09/10/21 0512  NA 130* 133* 132* 135 134*  K 3.7 3.7 3.5 3.6 3.8  CL 98 103 104 104 99  CO2 24 24 23 27 29   GLUCOSE 221* 131* 114* 121* 127*  BUN 10 10 17 15 17   CREATININE 0.74 0.56* 0.65 0.56* 0.64  CALCIUM 7.9* 7.9* 8.0* 7.9* 8.1*  MG 2.1 2.1 2.0 1.9 2.2  PHOS 3.0 2.6 2.0* 2.2* 3.0   GFR: Estimated Creatinine Clearance: 89.7 mL/min (by C-G formula based on SCr of 0.64 mg/dL). Liver Function Tests: Recent Labs  Lab 09/06/21 1707 09/07/21 0748 09/08/21 0622 09/09/21 0546 09/10/21 0512  AST 24 17 19 16 19   ALT 23 21 20  19 21   ALKPHOS 68 63 64 55 57  BILITOT 0.8 0.5 0.3 0.3 0.4  PROT 6.9 6.5 6.4* 5.9* 6.5  ALBUMIN 2.5* 2.3* 2.3* 2.2* 2.5*   No results for input(s): LIPASE, AMYLASE in the last 168 hours. No results for input(s): AMMONIA in the last 168 hours. Coagulation Profile: Recent Labs  Lab 09/07/21 0748  INR 1.3*   Cardiac Enzymes: No results for input(s): CKTOTAL, CKMB, CKMBINDEX, TROPONINI in the last 168 hours. BNP (last 3 results) No results for input(s): PROBNP in the last 8760 hours. HbA1C: No results for input(s): HGBA1C in the last 72 hours. CBG: Recent Labs  Lab 09/07/21 0746 09/08/21 0736 09/09/21 0756 09/10/21 0806  GLUCAP 142* 77 109* 117*   Lipid Profile: No results for input(s): CHOL, HDL, LDLCALC, TRIG, CHOLHDL, LDLDIRECT in the last 72 hours. Thyroid Function Tests: No results for input(s): TSH, T4TOTAL, FREET4, T3FREE, THYROIDAB in the last 72 hours. Anemia Panel: No results for input(s): VITAMINB12, FOLATE, FERRITIN, TIBC, IRON, RETICCTPCT in the last 72 hours. Sepsis Labs: No results for input(s): PROCALCITON, LATICACIDVEN in the last 168 hours.  No results found for this or any previous visit (from the past 240 hour(s)).   Radiology Studies: No results found.   Scheduled Meds:  dexamethasone  4 mg Oral Q12H   docusate sodium  100 mg Oral BID   feeding supplement  237 mL Oral TID BM   folic acid  1 mg Oral Daily   heparin  5,000 Units Subcutaneous Q8H   multivitamin with minerals  1 tablet Oral Daily   thiamine  100 mg Oral Daily   Or   thiamine  100 mg Intravenous Daily   Continuous Infusions:   LOS: 4 days   [place], DO [facility] Available via Epic secure chat 7am-7pm After these hours, please refer to coverage provider listed on [email or web address] 09/10/2021, 12:01 PM

## 2021-09-10 NOTE — Progress Notes (Signed)
I spoke with the patient's brother and surrogate decision maker. We reviewed Mr. Amrhein CT. He's doing better over the weekend with speech and function. Dr. Lisbeth Renshaw is in agreement to offer whole brain radiation, possibly palliative as well to the neck/spine. Our staff will try to coordinate this either this afternoon or tomorrow morning.     Carola Rhine, PAC

## 2021-09-11 ENCOUNTER — Other Ambulatory Visit: Payer: Self-pay | Admitting: Radiation Oncology

## 2021-09-11 ENCOUNTER — Ambulatory Visit: Payer: PPO | Admitting: Radiation Oncology

## 2021-09-11 ENCOUNTER — Encounter: Payer: Self-pay | Admitting: *Deleted

## 2021-09-11 DIAGNOSIS — C7931 Secondary malignant neoplasm of brain: Secondary | ICD-10-CM | POA: Diagnosis not present

## 2021-09-11 LAB — PHOSPHORUS: Phosphorus: 2.9 mg/dL (ref 2.5–4.6)

## 2021-09-11 LAB — CBC WITH DIFFERENTIAL/PLATELET
Abs Immature Granulocytes: 0.39 10*3/uL — ABNORMAL HIGH (ref 0.00–0.07)
Basophils Absolute: 0.1 10*3/uL (ref 0.0–0.1)
Basophils Relative: 0 %
Eosinophils Absolute: 0.2 10*3/uL (ref 0.0–0.5)
Eosinophils Relative: 1 %
HCT: 36.5 % — ABNORMAL LOW (ref 39.0–52.0)
Hemoglobin: 11.9 g/dL — ABNORMAL LOW (ref 13.0–17.0)
Immature Granulocytes: 2 %
Lymphocytes Relative: 11 %
Lymphs Abs: 2.7 10*3/uL (ref 0.7–4.0)
MCH: 27.4 pg (ref 26.0–34.0)
MCHC: 32.6 g/dL (ref 30.0–36.0)
MCV: 83.9 fL (ref 80.0–100.0)
Monocytes Absolute: 2.5 10*3/uL — ABNORMAL HIGH (ref 0.1–1.0)
Monocytes Relative: 10 %
Neutro Abs: 18.7 10*3/uL — ABNORMAL HIGH (ref 1.7–7.7)
Neutrophils Relative %: 76 %
Platelets: 538 10*3/uL — ABNORMAL HIGH (ref 150–400)
RBC: 4.35 MIL/uL (ref 4.22–5.81)
RDW: 17.2 % — ABNORMAL HIGH (ref 11.5–15.5)
WBC: 24.5 10*3/uL — ABNORMAL HIGH (ref 4.0–10.5)
nRBC: 0 % (ref 0.0–0.2)

## 2021-09-11 LAB — COMPREHENSIVE METABOLIC PANEL
ALT: 27 U/L (ref 0–44)
AST: 23 U/L (ref 15–41)
Albumin: 2.5 g/dL — ABNORMAL LOW (ref 3.5–5.0)
Alkaline Phosphatase: 54 U/L (ref 38–126)
Anion gap: 7 (ref 5–15)
BUN: 15 mg/dL (ref 6–20)
CO2: 25 mmol/L (ref 22–32)
Calcium: 8 mg/dL — ABNORMAL LOW (ref 8.9–10.3)
Chloride: 99 mmol/L (ref 98–111)
Creatinine, Ser: 0.66 mg/dL (ref 0.61–1.24)
GFR, Estimated: >60 mL/min (ref >60)
Glucose, Bld: 93 mg/dL (ref 70–99)
Potassium: 3.8 mmol/L (ref 3.5–5.1)
Sodium: 131 mmol/L — ABNORMAL LOW (ref 135–145)
Total Bilirubin: 0.4 mg/dL (ref 0.3–1.2)
Total Protein: 6.5 g/dL (ref 6.5–8.1)

## 2021-09-11 LAB — MAGNESIUM: Magnesium: 2.1 mg/dL (ref 1.7–2.4)

## 2021-09-11 LAB — GLUCOSE, CAPILLARY: Glucose-Capillary: 89 mg/dL (ref 70–99)

## 2021-09-11 MED ORDER — MEMANTINE HCL 5 MG PO TABS: ORAL_TABLET | ORAL | 0 refills | Status: AC

## 2021-09-11 MED ORDER — MEMANTINE HCL 10 MG PO TABS: 10.0000 mg | ORAL_TABLET | Freq: Two times a day (BID) | ORAL | 4 refills | Status: DC

## 2021-09-11 MED ORDER — MEMANTINE HCL 10 MG PO TABS
5.0000 mg | ORAL_TABLET | Freq: Every morning | ORAL | Status: DC
  Administered 2021-09-12: 5 mg via ORAL
  Filled 2021-09-11: qty 1

## 2021-09-11 NOTE — Progress Notes (Signed)
  Transition of Care [facility]) Screening Note   Patient Details  Name: Douglas Holt Date of Birth: 10-20-1960   Transition of Care [facility]) CM/SW Contact:    Deavion Strider, Marjie Skiff, RN Phone Number: 09/11/2021, 3:08 PM    Transition of Care Department [facility]) has reviewed patient and no TOC needs have been identified at this time. We will continue to monitor patient advancement through interdisciplinary progression rounds. If new patient transition needs arise, please place a TOC consult.

## 2021-09-11 NOTE — Progress Notes (Signed)
Foundation one and PDL-1 testing requested from biopsy on September 07, 2021 lung biopsy

## 2021-09-11 NOTE — Progress Notes (Signed)
During simulation yesterday the patient and I discussed the risks of cognitive defecits from whole brain radiation and he's in agreement with the desire to minimize this if possible. We discussed the rationale to consider Namenda as it was used in studies of small cell lung cancer patients who received radiation. We still do not know his histology, however this is considered in other tumor types than just small cell. We reviewed the side effect profile and he is in agreement to try this. I will send in a new prescription for the titration dose , and subsequent refills for the total duration of 6 months of therapy, and start his dose inpt when he begins radiation on Thursday.     Carola Rhine, PAC

## 2021-09-11 NOTE — Progress Notes (Signed)
Chaplain engaged in an initial visit with Shanon Brow.  Chaplain provided Shanon Brow with the [employer], Healthcare POA document.  Chaplain is following up from a conversation Aum's brother had with [employer].  Ankush voiced that he desires to assign his brother as his healthcare agent and do it as quickly as possible.  Chaplain found it hard to understand what he was trying to convey and asked if his brother would be present today or tomorrow.  Kowen voiced understanding that his brother doesn't have to be present to have that paperwork completed.  Chaplain is going to follow-up tomorrow morning for the completion and notarization of the paperwork.     09/11/21 1600  Clinical Encounter Type  Visited With Patient  Visit Type Initial;Social support

## 2021-09-11 NOTE — Progress Notes (Addendum)
HEMATOLOGY-ONCOLOGY PROGRESS NOTE  ASSESSMENT AND PLAN: Metastatic non-small cell lung cancer with neck adenopathy and brain metastases.   -CT soft tissue neck 09/05/1998 23-1.6 x 1.1 cm hyperenhancing and centrally cystic/necrotic right level 2/3 lymph node consistent with nodal metastatic disease, prominent metastatic lymphadenopathy in the right lower neck, right retroclavicular region, right axilla, and mediastinum, lesions in the right paraspinal musculature at C2-C3 and T2-T3, 2.4 x 2.0 cm enhancing intracranial metastasis with the left temporal lobe with prominent surrounding edema, 2.6 x 2.5 cm peripherally enhancing metastasis within the medial right cerebellar hemisphere with surrounding edema. -CT chest 09/04/2021-highly aggressive appearing partially cavitary right upper lobe mass. -CT head with and without contrast 09/07/2021-6-7 metastatic lesions identified with associated edema and regional mass effect -Biopsy of a right supraclavicular lymph node performed 09/07/2021-preliminary report consistent with non-small cell lung cancer -stains pending. Rheumatoid arthritis Expressive aphasia, right-sided weakness, and altered mental status-likely secondary to brain metastases, improved on Decadron History of tobacco and alcohol use Hyponatremia on a chemistry panel 08/15/2021  Douglas Holt appears improved.  He underwent a biopsy of a supraclavicular lymph node on 09/07/2021.  I spoke with Dr. Vic Ripper of pathology this morning who reported that the biopsy was consistent with non-small cell lung cancer.  Additional stains are pending to further characterize.  I discussed these results with the patient today.  We again discussed that no therapy would be curative as he has metastatic disease.  Recommend continuation of oral dexamethasone.  Radiation oncology has stimulated the patient with plans to begin whole brain radiation later this week.  We further discussed that he will receive systemic chemotherapy  as an outpatient.  We will send tissue for PD-L1 and Foundation One testing.  From our standpoint, the patient may be discharged home with outpatient follow-up.  TOC team involved in assisting with transportation and disposition planning.  Recommendations: 1.  Whole brain radiation per radiation oncology. 2.  Continue oral dexamethasone.  Taper per radiation oncology. 3.  Lymph node biopsy will be sent for PD-L1 and Foundation One testing. 4.  We will arrange outpatient follow-up to discuss systemic treatment options.  Douglas Holt  Douglas Holt was interviewed and examined.  He appears stable.  The lymph node biopsy has returned consistent with non-small cell lung cancer.  I discussed the pathology finding with Douglas Holt.  We will submit tissue for PD-L1 and NexGen ration sequencing.  We will make a decision on systemic treatment based on the molecular testing.  Outpatient follow-up will be scheduled at the Cancer center toward the end of the brain radiation.  I was present for greater than 50% of today's visit. I performed medical decision making. SUBJECTIVE: Douglas Holt denies headaches this morning.  No new complaints.  Wants to go home.  Douglas Bussing, NP   PHYSICAL EXAMINATION:  Vitals:   09/10/21 2201 09/11/21 0550  BP: 109/75 133/83  Pulse: 71 70  Resp: 17 17  Temp: (!) 97.5 F (36.4 C) 97.7 F (36.5 C)  SpO2: 99% 100%   Filed Weights   09/10/21 0500 09/10/21 0716 09/11/21 0500  Weight: 60.5 kg 63.8 kg 64 kg    Intake/Output from previous day: 06/05 0701 - 06/06 0700 In: 600 [P.O.:600] Out: -   Physical Exam Vitals reviewed.  HENT:     Head: Normocephalic.  Eyes:     General: No scleral icterus. Cardiovascular:     Rate and Rhythm: Normal rate and regular rhythm.  Pulmonary:     Comments: Diminished breath  sounds on the right. Abdominal:     Palpations: Abdomen is soft.     Tenderness: There is no abdominal tenderness.  Lymphadenopathy:     Comments:  Palpable right cervical and supraclavicular adenopathy.  Skin:    General: Skin is warm and dry.  Neurological:     Mental Status: He is alert.     Comments: Expressive aphasia improved    LABORATORY DATA:  I have reviewed the data as listed    Latest Ref Rng & Units 09/10/2021    5:12 AM 09/09/2021    5:46 AM 09/08/2021    6:22 AM  CMP  Glucose 70 - 99 mg/dL 127   121   114    BUN 6 - 20 mg/dL $Remove'17   15   17    'ruKkAXt$ Creatinine 0.61 - 1.24 mg/dL 0.64   0.56   0.65    Sodium 135 - 145 mmol/L 134   135   132    Potassium 3.5 - 5.1 mmol/L 3.8   3.6   3.5    Chloride 98 - 111 mmol/L 99   104   104    CO2 22 - 32 mmol/L $RemoveB'29   27   23    'hNeiwvid$ Calcium 8.9 - 10.3 mg/dL 8.1   7.9   8.0    Total Protein 6.5 - 8.1 g/dL 6.5   5.9   6.4    Total Bilirubin 0.3 - 1.2 mg/dL 0.4   0.3   0.3    Alkaline Phos 38 - 126 U/L 57   55   64    AST 15 - 41 U/L $Remo'19   16   19    'SbUla$ ALT 0 - 44 U/L $Remo'21   19   20      'JvwFd$ Lab Results  Component Value Date   WBC 24.5 (H) 09/11/2021   HGB 11.9 (L) 09/11/2021   HCT 36.5 (L) 09/11/2021   MCV 83.9 09/11/2021   PLT 538 (H) 09/11/2021   NEUTROABS 18.7 (H) 09/11/2021    No results found for: CEA1, CEA, IZX281, CA125, PSA1  CT HEAD W & WO CONTRAST (5MM)  Result Date: 09/07/2021 CLINICAL DATA:  Brain metastases EXAM: CT HEAD WITHOUT AND WITH CONTRAST TECHNIQUE: Contiguous axial images were obtained from the base of the skull through the vertex without and with intravenous contrast. RADIATION DOSE REDUCTION: This exam was performed according to the departmental dose-optimization program which includes automated exposure control, adjustment of the mA and/or kV according to patient size and/or use of iterative reconstruction technique. CONTRAST:  7mL OMNIPAQUE IOHEXOL 300 MG/ML  SOLN COMPARISON:  None Available. FINDINGS: Brain: Several brain metastases are identified demonstrating hyperdensity peripherally and central necrosis. Hyperdensity probably reflects intralesional hemorrhage. Largest  supratentorially measures up to 2.3 cm in the left frontal lobe. Infratentorially, there is a 3.3 cm lesion in the paramedian right cerebellum. There are 6 lesions identified. Possible seventh lesion of the lateral right cerebellum on series 5, image 56. There is associated edema and regional mass effect. No midline shift. Partial effacement of the fourth and left lateral ventricles. No hydrocephalus. Gray-white differentiation is preserved. There is no extra-axial fluid collection. No hydrocephalus. Vascular: There is atherosclerotic calcification at the skull base. Skull: Calvarium is unremarkable. Sinuses/Orbits: No acute finding. Other: None. IMPRESSION: 6-7 metastatic lesions identified with associated edema and regional mass effect. Electronically Signed   By: Macy Mis M.D.   On: 09/07/2021 15:02   CT Soft Tissue Neck W  Contrast  Result Date: 09/05/2021 CLINICAL DATA:  Provided history: Neck mass. Neck mass, nonpulsatile. Additional history provided by scanning technologist: Patient reports difficulty speaking, knots on right side of neck for 1-2 months. 120 pack-year current smoker. EXAM: CT NECK WITH CONTRAST TECHNIQUE: Multidetector CT imaging of the neck was performed using the standard protocol following the bolus administration of intravenous contrast. RADIATION DOSE REDUCTION: This exam was performed according to the departmental dose-optimization program which includes automated exposure control, adjustment of the mA and/or kV according to patient size and/or use of iterative reconstruction technique. CONTRAST:  9mL ISOVUE-300 IOPAMIDOL (ISOVUE-300) INJECTION 61% COMPARISON:  None Available. FINDINGS: Pharynx and larynx: No appreciable swelling or mass within the oral cavity, pharynx or larynx. Postinflammatory calcifications/tonsilloliths within the right palatine tonsil. Salivary glands: Atrophic appearance of the bilateral parotid glands. Unremarkable appearance of the submandibular  glands. Thyroid: Unremarkable. Lymph nodes: 1.6 x 1.1 cm centrally cystic/necrotic and peripherally hyperenhancing right level 2/3 lymph node deep to the right-sided skin surface marker (series 2, image 62) (series 12, image 22). Numerous enlarged lymph nodes within the right lower neck, right retroclavicular region, partially imaged right axilla and partially imaged mediastinum. For instance, an enlarged right retroclavicular lymph node measures 2.5 x 1.5 cm in transaxial dimensions (series 10, image 60). Some of these lymph nodes are hyperenhancing and centrally cystic/necrotic change. The partially imaged mediastinal lymphadenopathy is extensive. Vascular: The major vascular structures of the neck are patent. Atherosclerotic plaque within the visualized aortic arch, proximal major branch vessels of the neck and carotid arteries. Limited intracranial: 2.4 x 2.0 cm enhancing metastasis within the left temporal lobe with prominent surrounding edema (series 2, image 2). 2.6 x 2.5 cm peripherally enhancing metastasis within the medial right cerebellar hemisphere with surrounding edema (series 2, image 27). Visualized orbits: The orbits are largely excluded from the field of view. Mastoids and visualized paranasal sinuses: No significant paranasal sinus disease or mastoid effusion at the imaged levels. Skeleton: No acute bony abnormality or aggressive osseous lesion identified. C2-C3 grade 1 retrolisthesis. Cervical spondylosis with multilevel disc space narrowing, disc bulges/central disc protrusion, endplate spurring, uncovertebral hypertrophy and facet arthrosis. Disc space narrowing is greatest at C5-C6 and C6-C7 (advanced at these levels). Multilevel spinal canal stenosis. Most notably, a broad-based central disc protrusion contributes to at least moderate spinal canal stenosis at C5-C6. Multilevel bony neural foraminal narrowing. Upper chest: Please refer to the concurrently performed and separately reported  chest CT for a description of intrathoracic findings, including but not limited to a large right upper lobe lung mass and extensive mediastinal lymphadenopathy. Other: 1.5 x 1.9 x 1.6 cm peripherally enhancing, centrally cystic/necrotic lesion within the paraspinal musculature at midline and to the right at the C2-C3 level (series 2, image 52). 2.1 x 2.4 x 1.8 cm peripherally enhancing and centrally cystic/necrotic lesion within the right paraspinal musculature at the T2-T3 level (series 2, image 92). These lesions likely reflect tumor deposits. Impressions 1,2,3,4 will be called to the ordering clinician or representative by the Radiologist Assistant, and communication documented in the PACS or [employer]. IMPRESSION: 1. 1.6 x 1.1 cm hyperenhancing and centrally cystic/necrotic right level 2/3 lymph node located immediately deep to the right-sided skin surface marker. This is consistent with nodal metastatic disease. Prominent metastatic lymphadenopathy is also present within the right lower neck, right retroclavicular region, partially imaged right axilla and partially imaged mediastinum. 2. Peripherally enhancing and centrally cystic/necrotic lesions in the right paraspinal musculature at C2-C3 and T2-T3, as described. These  lesions likely reflect metastatic tumor deposits. 3. 2.4 x 2.0 cm enhancing intracranial metastasis within the left temporal lobe with prominent surrounding edema. 2.6 x 2.5 cm peripherally enhancing metastasis within the medial right cerebellar hemisphere with surrounding edema. A brain MRI without and with contrast is recommended for further evaluation. 4. Please refer to the concurrently performed and separately reported chest CT for a description of intrathoracic findings, including but not limited to a large right upper lobe lung mass. 5. Atherosclerotic plaque within the visualized aortic arch, proximal major branch vessels of the neck and carotid arteries. Aortic Atherosclerosis  (ICD10-I70.0). 6. Cervical spondylosis, as described. Electronically Signed   By: Kellie Simmering D.O.   On: 09/05/2021 11:20   CT CHEST LUNG CA SCREEN LOW DOSE W/O CM  Result Date: 09/06/2021 CLINICAL DATA:  60 year old male current smoker with 120 pack-year history of smoking. Lung cancer screening examination. EXAM: CT CHEST WITHOUT CONTRAST LOW-DOSE FOR LUNG CANCER SCREENING TECHNIQUE: Multidetector CT imaging of the chest was performed following the standard protocol without IV contrast. RADIATION DOSE REDUCTION: This exam was performed according to the departmental dose-optimization program which includes automated exposure control, adjustment of the mA and/or kV according to patient size and/or use of iterative reconstruction technique. COMPARISON:  No priors. FINDINGS: Cardiovascular: Heart size is normal. There is no significant pericardial fluid, thickening or pericardial calcification. There is aortic atherosclerosis, as well as atherosclerosis of the great vessels of the mediastinum and the coronary arteries, including calcified atherosclerotic plaque in the left anterior descending and right coronary arteries. Mild calcifications of the mitral annulus. Mediastinum/Nodes: Bulky subcarinal lymphadenopathy measuring up to 2.2 cm in short axis. Fullness in the right hilar region poorly evaluated on today's noncontrast CT examination, but suspicious for right hilar lymphadenopathy. Bulky low right paratracheal lymph node measuring 2.2 cm in short axis. Other high right paratracheal lymphadenopathy measuring up to 1.8 cm in short axis in the superior mediastinum. Esophagus is unremarkable in appearance. No axillary lymphadenopathy. Lungs/Pleura: Large macrolobulated partially cavitary spiculated right upper lobe mass (axial image 70) with a volume derived mean diameter of 6.2 cm, highly concerning for primary bronchogenic carcinoma such as a squamous cell carcinoma. Several other smaller pulmonary nodules are  also noted. No acute consolidative airspace disease. No pleural effusions. Mild diffuse bronchial wall thickening with mild centrilobular and paraseptal emphysema. Upper Abdomen: Aortic atherosclerosis. 2.2 x 1.2 cm intermediate attenuation (29 HU) right adrenal nodule. 3.1 x 2.4 cm intermediate attenuation (37 HU) left adrenal mass. Musculoskeletal: There are no aggressive appearing lytic or blastic lesions noted in the visualized portions of the skeleton. IMPRESSION: 1. 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 This is associated with probable right hilar and mediastinal lymphadenopathy, as well as bilateral adrenal nodules which may represent metastatic lesions. Additional imaging evaluation or consultation with Pulmonology or Thoracic Surgery recommended. 2. The "S" modifier above refers to potentially clinically significant non lung cancer related findings. Specifically, there is aortic atherosclerosis, in addition to two-vessel coronary artery disease. Please note that although the presence of coronary artery calcium documents the presence of coronary artery disease, the severity of this disease and any potential stenosis cannot be assessed on this non-gated CT examination. Assessment for potential risk factor modification, dietary therapy or pharmacologic therapy may be warranted, if clinically indicated. 3. Mild diffuse bronchial wall thickening with mild centrilobular and paraseptal emphysema; imaging findings suggestive  of underlying COPD. 4. There are calcifications of the mitral annulus. Echocardiographic correlation for evaluation of potential valvular dysfunction may be warranted if clinically indicated. These results will be called to the ordering clinician or representative by the Radiologist Assistant, and communication documented in the PACS or  [employer]. Aortic Atherosclerosis (ICD10-I70.0) and Emphysema (ICD10-J43.9). Electronically Signed   By: Vinnie Langton M.D.   On: 09/06/2021 06:36   [place] CORE BIOPSY (LYMPH NODES)  Result Date: 09/07/2021 INDICATION: 61 year old with right lung mass, cervical lymphadenopathy and intracranial lesions. Findings are suggestive for metastatic disease. Tissue diagnosis is needed. EXAM: ULTRASOUND-GUIDED RIGHT SUPRACLAVICULAR LYMPH NODE BIOPSY MEDICATIONS: None. ANESTHESIA/SEDATION: Moderate (conscious) sedation was employed during this procedure. A total of Versed 2.0 mg and Fentanyl 100 mcg was administered intravenously by the radiology nurse. Total intra-service moderate Sedation Time: 10 minutes. The patient's level of consciousness and vital signs were monitored continuously by radiology nursing throughout the procedure under my direct supervision. FLUOROSCOPY TIME:  None COMPLICATIONS: None immediate. PROCEDURE: Informed consent was obtained from the patient's brother because the patient is not able to give consent due to altered mental status. A timeout was performed prior to the initiation of the procedure. Right side of the neck was evaluated with ultrasound. An abnormal right supraclavicular lymph node was targeted for biopsy. Right side of the neck was prepped with chlorhexidine and sterile field was created. Skin was anesthetized using 1% lidocaine. A small incision was made. Using ultrasound guidance, an 18 gauge core biopsy needle was directed into the lymph node. Total of 5 core biopsies were obtained. Specimens placed in saline. Bandage placed over the puncture site. FINDINGS: Several small hypoechoic lymph nodes along the right side of the neck compatible with metastatic lymphadenopathy. A superficial lymph node in the right supraclavicular region was biopsied. Adequate specimens obtained. IMPRESSION: Ultrasound-guided core biopsy of a right supraclavicular lymph node. Electronically Signed    By: Markus Daft M.D.   On: 09/07/2021 14:52     Future Appointments  Date Time Provider [place]  09/12/2021 12:00 PM Kyung Rudd, MD [facility] None  09/13/2021 12:00 PM [facility] [id number] [facility] None  09/14/2021 12:00 PM [facility] [id number] [facility] None  09/17/2021 12:00 PM [facility] [id number] [facility] None  09/18/2021 12:00 PM [facility] [id number] [facility] None  09/19/2021 12:00 PM [facility] [id number] [facility] None  09/20/2021  2:15 PM [facility] [id number] [facility] None  09/21/2021  3:30 PM [facility] [id number] [facility] None  09/24/2021  3:00 PM [facility] [id number] [facility] None  09/25/2021  1:30 PM [facility] [id number] [facility] None       LOS: 5 days

## 2021-09-11 NOTE — Progress Notes (Signed)
PROGRESS NOTE    Douglas Holt  EHM:[id number] DOB: 1960-10-26 DOA: 09/06/2021 PCP: Maximiano Coss, NP   Brief Narrative:  Douglas Holt is a 61 y.o. 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  Patient had rapid deterioration and his results on his imaging was concerning given that he had speech difficulties for about the last few weeks and a poor appetite.  He was evaluated by the oncologist today for evaluation of a lung mass, neck adenopathy as well as brain metastasis.  It was recommended that he go to the ED yesterday however he refuses but was agreeable to coming to the oncology clinic today.  He had a CT of the chest and neck CT yesterday with findings of cavitary right upper lobe mass and probable right hilar medicine lymphadenopathy, bilateral adrenal nodules and a right neck adenopathy as well as necrotic lesions in the right paraspinal musculature, peripheral enhancing metastasis within the medial right cerebellar hemorrhage with surrounding edema.  He continues to have aphasia and difficulty speaking properly for last few weeks with garbled speech and patient's mother noticed that he also is having trouble using his right hand.  Patient's brother was concerned that he is losing weight and appetite appears poor and patient is having some head pressure.  At the urging of the oncologist he was sent to the hospital and given 10 mg of IV Decadron for further work-up and evaluation for his brain metastasis.  Interventional radiology was consulted for a node biopsy and repeat head CT scan within contrast was ordered given recommendations of the oncologist.  Patient was given IV Decadron 10 mg in the  clinic and will be placed on scheduled Decadron now.  Dr. Benay Spice has already consulted radiation oncology for further evaluation recommendations   He underwent biopsy and under went a head CT scan with and without contrast however he is refusing his MRI.  Rad Onc evaluated and will initiate Radiation.  We will need to determine if patient has rides for radiation for safe discharge plan.  PT OT recommending no follow-up however we do not know if he is able to come daily for radiation treatments and this needs to be determined by case management\  Assessment and Plan:  Cavitary right upper lobe mass with associated with right hilar mediastinal lymphadenopathy, right neck adenopathy as well as necrotic lesions in the right paraspinal musculature and peripherally enhancing metastasis in the medial right cerebellar hemisphere with associated edema -Recently had a CT scan 3 days ago and presents with neurological symptoms.  Admit to inpatient MedSurg -Brain metastasis were identified on neck CT scan so he will need a proper CT scan of the head with and without contrast and this was ordered but not done yet -Radiation oncology evaluated and wants to get a baseline MRI and see what they are treating and so they recommended MRI of the brain with and without contrast as well as an MRI of the cervical spine but the patient has refused MRIs at this time -Given this radiation oncology will defer his simulation today and reevaluate next week if patient is agreeable -Patient has palpable neck lymphadenopathy so interventional radiology has been consulted for a biopsy  to confirm diagnosis and distinguish between likely non-small cell cancer versus small cell cancer; biopsies pending to be done -Continue supportive care and now IVF now stopped -Check CBC, CMP, mag, Phos again in the AM  -Started on Decadron given his brain metastasis and likely surrounding edema causing his neurological symptoms and he is given 10  mg IV x1 in the clinic and will be started on scheduled Decadron 4 mg every 6 scheduled and now discontinued and being changed to 4 mg po BID and taper per Rad Onc -We will need a diagnostic biopsy and initiation of treatment depending on the biopsy results and radiation oncology has also been consulted -Pain Control with Oxycodone IR and Acetaminophen -Palliative care has been consulted as well as radiation oncology -Radiation oncology is consulted for brain stimulation and they have recommended a baseline MRI to evaluate with her treating and to monitor disease progression but patient refuses the MRI but after review of the CT Scan and improvement Dr. Lisbeth Renshaw is in agreement for whole brain radiation and possibly palliative as well to the Neck/Spine; WILL NEED TO DETERMINE whether or not patient can calm daily for radiation treatments and this needs to be evaluated by the case manager. -He underwent biopsy of a lymph node from the right supraclavicular area and results preliminary reporting consistent with non-small cell lung cancer with stains pending and foundation 1 and PD-L1 testing were also sent off -TOC has been assisted for disposition planning and PT OT are evaluating and recommending no follow-up   Expressive aphasia with right-sided weakness, improving  -Likely in the setting of his brain metastasis; symptoms have been persistent for the last few weeks or so if not longer -Get a head CT scan with and without contrast and it showed "6-7 metastatic lesions identified with associated edema and regional mass effect." -Reportedly had a poor p.o. appetite so we will get nutritionist consult and SLP evaluation -Started on IV dexamethasone; may need neurology consult -Radiation oncology has ordered MRIs of his brain and cervical spine but patient is refused this and other interventions -Per radiation oncology his CT of the neck is not an ideal study to truly know what they would be treating and  when I have a baseline to compare for response to later -They feel a CT of the head is not as ideal but would be better than what they have currently still because of the patient refusing to obtain a MRI for baseline purposes patient simulation for radiation has been canceled currently -Still Refusing MRI at this time  -We will continue with steroids and trying to see if we will be for brain imaging at a later time -Palliative care has been consulted as well for possible discussion for  possible hospice at home if patient continues to refuse interventions; he is now DNR/DNI and is able to speak but still has some dysarthria and he is invested in plan for disease modifying therapy and wants to pursue other what ever interventions are available to him based on biopsy results -Patient still does not want to MRI and reports having MRIs in the past she had a severe anxiety panic attack   Tobacco Abuse -Smoking cessation counseling given -Provide nicotine patch if warranted and if patient has withdrawals   Leukocytosis -In the setting of steroid demargination  and WBC has gone from 11.5 -> 16.2 -> 15.0 -> 27.4 -> 22.1 -> 21.2 is now 24.5 -Patient received IV dexamethasone 10 mg x 1 and is now  on scheduled dexamethasone 4 mg every 6 scheduled but is being changed to po 4 mg BID  -Continue to monitor for signs and symptoms of infection -Repeat CBC in a.m.   Normocytic Anemia -Likely dilutional drop in the setting of IV fluid hydration -Patient's hemoglobin/hematocrit went from 13.3/40.6 -> 11.2/34.0 -> 10.8/33.0 -> 11.4/34.4 and is now 11.9/36.5 and is relatively stable for the last few days -Check anemia panel in a.m. -Continue monitor for signs and symptoms of bleeding; no overt bleeding noted -Repeat CBC in a.m.   Thrombocytosis -Patient's platelet count went from 425 and trended up to 511 x2 -> 535 and is now 538 -Continue to monitor and trend and repeat CBC in the a.m.   History of alcohol  abuse -Placed on CIWA protocol and placed on thiamine, folic acid, multivitamin with minerals   Hypophosphatemia -Patient's Phos level is now 3.0 -Continue to monitor and replete as necessary Repeat Phos level in the a.m.   Hyponatremia  -Repeat labs here and continue IV fluid hydration and reduce rate to 75 mL/hr x12 hours  -Sodium was 128 -> 133 -> 132 -> 135 -> 134 and is now dropped to 131 -Continue to monitor and trend and repeat CMP in a.m.   Hypoalbuminemia -Mild patient's albumin went from 2.9 ->  2.3 -> 2.2 -> 2.5 x2 -See nutrition consult as below   Severe malnutrition in the context of chronic illness underweight -Nutrition was consulted and appreciate further evaluation recommendations -Nutrition Status: Nutrition Problem: Severe Malnutrition Etiology: chronic illness (new lung mass with brain mets) Signs/Symptoms: percent weight loss, mild fat depletion, severe muscle depletion, energy intake < or equal to 75% for > or equal to 1 month Interventions: Ensure Enlive (each supplement provides 350kcal and 20 grams of protein), MVI  DVT prophylaxis: heparin injection 5,000 Units Start: 09/07/21 2200 SCDs Start: 09/06/21 1720    Code Status: DNR Family Communication: No family currently at bedside  Disposition Plan:  Level of care: Med-Surg Status is: Inpatient Remains inpatient appropriate because: Needs to determine if we can discharge him home safely given that he needs to have rides daily for radiation   Consultants:  IR Medical Oncology Radiation Oncology Palliative Care Medicine   Procedures:  Head CT with and without contrast LN Biopsy (Supraclavicular) Radiation  Antimicrobials:  Anti-infectives (From admission, onward)    None       Subjective: Seen and examined at bedside and he is doing better today but he is very frustrated that he is unable to sleep given that he keeps getting discharged.  Continues to get radiation.  Denies chest pain or  shortness of breath and he is more coherent.  We will need to determine if he has rides daily for radiation to see if we can safely discharge him.  Objective: Vitals:   09/10/21 2201 09/11/21 0500 09/11/21 0550 09/11/21 1303  BP: 109/75  133/83 (!) 149/95  Pulse: 71  70 95  Resp: 17  17   Temp: (!) 97.5 F (36.4 C)  97.7 F (36.5 C)   TempSrc: Oral  Oral   SpO2: 99%  100%   Weight:  64 kg    Height:        Intake/Output Summary (Last 24 hours) at 09/11/2021 1714 Last data filed at 09/11/2021 0955 Gross per 24 hour  Intake 360 ml  Output --  Net 360 ml   Filed Weights   09/10/21 0500 09/10/21 0716 09/11/21 0500  Weight: 60.5 kg 63.8 kg 64  kg   Examination: Physical Exam:  Constitutional: Thin cachectic chronically ill-appearing Caucasian male currently no acute distress but is frustrated Respiratory: Diminished to auscultation bilaterally with coarse breath sound Cardiovascular: RRR, no murmurs / rubs / gallops. S1 and S2 auscultated. No extremity edema. 2+ pedal pulses. No carotid bruits.  Abdomen: Soft, non-tender, non-distended. No masses palpated. No appreciable hepatosplenomegaly. Bowel sounds positive.  GU: Deferred. Musculoskeletal: No clubbing / cyanosis of digits/nails. No joint deformity upper and lower extremities.  Skin: No rashes, lesions, ulcers has some scattered tattoos throughout his body Neurologic: CN 2-12 grossly intact with no focal deficits but does have some expressive aphasia.  Romberg sign cerebellar reflexes not assessed.  Psychiatric: Normal judgment and insight. Alert and oriented x 3. Normal mood and appropriate affect.   Data Reviewed: I have personally reviewed following labs and imaging studies  CBC: Recent Labs  Lab 09/07/21 0748 09/08/21 0622 09/09/21 0546 09/10/21 0512 09/11/21 0638  WBC 15.0* 27.4* 22.1* 21.2* 24.5*  NEUTROABS 13.7* 24.1* 19.3* 17.6* 18.7*  HGB 11.1* 11.2* 10.8* 11.4* 11.9*  HCT 33.4* 34.0* 33.0* 34.4* 36.5*  MCV  82.3 83.1 83.1 83.1 83.9  PLT 458* 511* 511* 535* 659*   Basic Metabolic Panel: Recent Labs  Lab 09/07/21 0748 09/08/21 0622 09/09/21 0546 09/10/21 0512 09/11/21 0638  NA 133* 132* 135 134* 131*  K 3.7 3.5 3.6 3.8 3.8  CL 103 104 104 99 99  CO2 _0 GLUCOSE 131* 114* 121* 127* 93  BUN _1 CREATININE 0.56* 0.65 0.56* 0.64 0.66  CALCIUM 7.9* 8.0* 7.9* 8.1* 8.0*  MG 2.1 2.0 1.9 2.2 2.1  PHOS 2.6 2.0* 2.2* 3.0 2.9   GFR: Estimated Creatinine Clearance: 90 mL/min (by C-G formula based on SCr of 0.66 mg/dL). Liver Function Tests: Recent Labs  Lab 09/07/21 0748 09/08/21 0622 09/09/21 0546 09/10/21 0512 09/11/21 0638  AST _2 ALT _3 ALKPHOS 63 64 55 57 54  BILITOT 0.5 0.3 0.3 0.4 0.4  PROT 6.5 6.4* 5.9* 6.5 6.5  ALBUMIN 2.3* 2.3* 2.2* 2.5* 2.5*   No results for input(s): LIPASE, AMYLASE in the last 168 hours. No results for input(s): AMMONIA in the last 168 hours. Coagulation Profile: Recent Labs  Lab 09/07/21 0748  INR 1.3*   Cardiac Enzymes: No results for input(s): CKTOTAL, CKMB, CKMBINDEX, TROPONINI in the last 168 hours. BNP (last 3 results) No results for input(s): PROBNP in the last 8760 hours. HbA1C: No results for input(s): HGBA1C in the last 72 hours. CBG: Recent Labs  Lab 09/07/21 0746 09/08/21 0736 09/09/21 0756 09/10/21 0806 09/11/21 0750  GLUCAP 142* 77 109* 117* 89   Lipid Profile: No results for input(s): CHOL, HDL, LDLCALC, TRIG, CHOLHDL, LDLDIRECT in the last 72 hours. Thyroid Function Tests: No results for input(s): TSH, T4TOTAL, FREET4, T3FREE, THYROIDAB in the last 72 hours. Anemia Panel: No results for input(s): VITAMINB12, FOLATE, FERRITIN, TIBC, IRON, RETICCTPCT in the last 72 hours. Sepsis Labs: No results for input(s): PROCALCITON, LATICACIDVEN in the last 168 hours.  No results found for this or any previous visit (from the past 240 hour(s)).   Radiology Studies: No results  found.   Scheduled Meds:  dexamethasone  4 mg Oral Q12H   docusate sodium  100 mg Oral BID   feeding supplement  237 mL Oral TID BM   folic acid  1 mg Oral Daily   heparin  5,000 Units Subcutaneous Q8H   [START ON 09/12/2021] memantine  5 mg Oral q AM   multivitamin with minerals  1 tablet Oral Daily   thiamine  100 mg Oral Daily   Or   thiamine  100 mg Intravenous Daily   Continuous Infusions:   LOS: 5 days   Raiford Noble, DO [facility] Available via Epic secure chat 7am-7pm After these hours, please refer to coverage provider listed on [email or web address] 09/11/2021, 5:14 PM

## 2021-09-12 ENCOUNTER — Ambulatory Visit: Payer: PPO | Admitting: Radiation Oncology

## 2021-09-12 ENCOUNTER — Encounter: Payer: Self-pay | Admitting: Nurse Practitioner

## 2021-09-12 DIAGNOSIS — C3490 Malignant neoplasm of unspecified part of unspecified bronchus or lung: Secondary | ICD-10-CM

## 2021-09-12 DIAGNOSIS — Z789 Other specified health status: Secondary | ICD-10-CM

## 2021-09-12 LAB — CBC WITH DIFFERENTIAL/PLATELET
Abs Immature Granulocytes: 0.55 10*3/uL — ABNORMAL HIGH (ref 0.00–0.07)
Basophils Absolute: 0.1 10*3/uL (ref 0.0–0.1)
Basophils Relative: 0 %
Eosinophils Absolute: 0.1 10*3/uL (ref 0.0–0.5)
Eosinophils Relative: 1 %
HCT: 37 % — ABNORMAL LOW (ref 39.0–52.0)
Hemoglobin: 12.1 g/dL — ABNORMAL LOW (ref 13.0–17.0)
Immature Granulocytes: 2 %
Lymphocytes Relative: 7 %
Lymphs Abs: 1.9 10*3/uL (ref 0.7–4.0)
MCH: 27.5 pg (ref 26.0–34.0)
MCHC: 32.7 g/dL (ref 30.0–36.0)
MCV: 84.1 fL (ref 80.0–100.0)
Monocytes Absolute: 2.7 10*3/uL — ABNORMAL HIGH (ref 0.1–1.0)
Monocytes Relative: 9 %
Neutro Abs: 23.2 10*3/uL — ABNORMAL HIGH (ref 1.7–7.7)
Neutrophils Relative %: 81 %
Platelets: 510 10*3/uL — ABNORMAL HIGH (ref 150–400)
RBC: 4.4 MIL/uL (ref 4.22–5.81)
RDW: 17.3 % — ABNORMAL HIGH (ref 11.5–15.5)
WBC: 28.5 10*3/uL — ABNORMAL HIGH (ref 4.0–10.5)
nRBC: 0 % (ref 0.0–0.2)

## 2021-09-12 LAB — COMPREHENSIVE METABOLIC PANEL
ALT: 23 U/L (ref 0–44)
AST: 21 U/L (ref 15–41)
Albumin: 2.4 g/dL — ABNORMAL LOW (ref 3.5–5.0)
Alkaline Phosphatase: 58 U/L (ref 38–126)
Anion gap: 4 — ABNORMAL LOW (ref 5–15)
BUN: 17 mg/dL (ref 6–20)
CO2: 27 mmol/L (ref 22–32)
Calcium: 8 mg/dL — ABNORMAL LOW (ref 8.9–10.3)
Chloride: 99 mmol/L (ref 98–111)
Creatinine, Ser: 0.61 mg/dL (ref 0.61–1.24)
GFR, Estimated: >60 mL/min (ref >60)
Glucose, Bld: 127 mg/dL — ABNORMAL HIGH (ref 70–99)
Potassium: 4.2 mmol/L (ref 3.5–5.1)
Sodium: 130 mmol/L — ABNORMAL LOW (ref 135–145)
Total Bilirubin: 0.4 mg/dL (ref 0.3–1.2)
Total Protein: 6.6 g/dL (ref 6.5–8.1)

## 2021-09-12 LAB — PHOSPHORUS: Phosphorus: 2.7 mg/dL (ref 2.5–4.6)

## 2021-09-12 LAB — GLUCOSE, CAPILLARY: Glucose-Capillary: 164 mg/dL — ABNORMAL HIGH (ref 70–99)

## 2021-09-12 LAB — MAGNESIUM: Magnesium: 2.1 mg/dL (ref 1.7–2.4)

## 2021-09-12 MED ORDER — DEXAMETHASONE 4 MG PO TABS: 4.0000 mg | ORAL_TABLET | Freq: Two times a day (BID) | ORAL | 0 refills | Status: AC

## 2021-09-12 MED ORDER — ACETAMINOPHEN 325 MG PO TABS: 650.0000 mg | ORAL_TABLET | Freq: Four times a day (QID) | ORAL | Status: AC | PRN

## 2021-09-12 MED ORDER — THIAMINE HCL 100 MG PO TABS: 100.0000 mg | ORAL_TABLET | Freq: Every day | ORAL | 0 refills | Status: AC

## 2021-09-12 MED ORDER — ONDANSETRON HCL 4 MG PO TABS: 4.0000 mg | ORAL_TABLET | Freq: Four times a day (QID) | ORAL | 0 refills | Status: DC | PRN

## 2021-09-12 MED ORDER — ADULT MULTIVITAMIN W/MINERALS CH: 1.0000 | ORAL_TABLET | Freq: Every day | ORAL | 0 refills | Status: AC

## 2021-09-12 MED ORDER — ENSURE ENLIVE PO LIQD: 237.0000 mL | Freq: Three times a day (TID) | ORAL | 12 refills | Status: AC

## 2021-09-12 MED ORDER — SENNOSIDES-DOCUSATE SODIUM 8.6-50 MG PO TABS: 1.0000 | ORAL_TABLET | Freq: Every evening | ORAL | 0 refills | Status: AC | PRN

## 2021-09-12 MED ORDER — PROMETHAZINE HCL 25 MG PO TABS: 25.0000 mg | ORAL_TABLET | Freq: Four times a day (QID) | ORAL | 0 refills | Status: AC | PRN

## 2021-09-12 MED ORDER — FOLIC ACID 1 MG PO TABS: 1.0000 mg | ORAL_TABLET | Freq: Every day | ORAL | 0 refills | Status: DC

## 2021-09-12 NOTE — Progress Notes (Incomplete)
[facility] Progress Note  Patient: Douglas Holt     MEQ:[id number]  DOA: 09/06/2021   PCP: Maximiano Coss, NP       Brief hospital course: This is a 61 year old male with rheumatoid arthritis, nicotine and alcohol abuse and on 5/31 had a CT showing right lung mass with metastasis to the brain.  The patient had speech difficulties and poor appetite.  He was evaluated by oncology on 6/1 and recommended to go to the ED for work-up of the new cancer.  During this appointment, it was noted that the patient was unable to give a history due to garbled speech (2 weeks). In the office, given 10 mg of IV Decadron 2 mg of IV morphine and directly admitted to [facility]. On exam in the hospital, noted to have palpable lymphadenopathy in the neck.  IR consulted to biopsy 1 of these lymph nodes. IV fluids started due to dehydration and poor oral intake. IV steroids continue due to brain metastasis with surrounding edema. Oxycodone and acetaminophen started for pain control. Dietitian consulted, placed on CIWA scale for possible alcohol withdrawal and supplemented with thiamine folic acid and multivitamins. 6/2-underwent ultrasound-guided core biopsy of right supraclavicular lymph node. 6/3 palliative care consult-DNR/DNI completed-declined MRI brain due to anxiety/panic attacks in the past.  Evaluated by physical therapy-noted PT follow-up. 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 [facility]) -Expressive aphasia and right-sided weakness improving with steroids - Radiation oncology has simulated the patient with plans to begin whole brain radiation later this week - Underwent biopsy as mentioned above-preliminary report consistent with non-small cell lung cancer-stains pending-oncology plans to start systemic chemotherapy as outpatient after stains molecular testing completed - Patient would like brother  to be healthcare power of attorney  Active Problems:    Expressive aphasia   Tobacco abuse   Protein-calorie malnutrition, severe Nutrition Status: Nutrition Problem: Severe Malnutrition Etiology: chronic illness (new lung mass with brain mets) Signs/Symptoms: percent weight loss, mild fat depletion, severe muscle depletion, energy intake < or equal to 75% for > or equal to 1 month Interventions: Ensure Enlive (each supplement provides 350kcal and 20 grams of protein), MVI     DVT prophylaxis:  ***   Code Status: DNR  Consultants: *** Level of Care: Level of care: Med-Surg Disposition Plan:  Status is: Inpatient {Inpatient:23812}  Objective:   Vitals:   09/11/21 0500 09/11/21 0550 09/11/21 1303 09/12/21 0606  BP:  133/83 (!) 149/95 105/78  Pulse:  70 95 78  Resp:  17  18  Temp:  97.7 F (36.5 C)  98.2 F (36.8 C)  TempSrc:  Oral  Oral  SpO2:  100%  100%  Weight: 64 kg     Height:       Filed Weights   09/10/21 0500 09/10/21 0716 09/11/21 0500  Weight: 60.5 kg 63.8 kg 64 kg   Exam: ***  Imaging and lab data was personally reviewed    CBC: Recent Labs  Lab 09/08/21 0622 09/09/21 0546 09/10/21 0512 09/11/21 0638 09/12/21 0515  WBC 27.4* 22.1* 21.2* 24.5* 28.5*  NEUTROABS 24.1* 19.3* 17.6* 18.7* 23.2*  HGB 11.2* 10.8* 11.4* 11.9* 12.1*  HCT 34.0* 33.0* 34.4* 36.5* 37.0*  MCV 83.1 83.1 83.1 83.9 84.1  PLT 511* 511* 535* 538* 297*   Basic Metabolic Panel: Recent Labs  Lab 09/08/21 0622 09/09/21 0546 09/10/21 0512 09/11/21 0638 09/12/21 0515  NA 132*  135 134* 131* 130*  K 3.5 3.6 3.8 3.8 4.2  CL 104 104 99 99 99  CO2 23 27 29 25 27   GLUCOSE 114* 121* 127* 93 127*  BUN 17 15 17 15 17   CREATININE 0.65 0.56* 0.64 0.66 0.61  CALCIUM 8.0* 7.9* 8.1* 8.0* 8.0*  MG 2.0 1.9 2.2 2.1 2.1  PHOS 2.0* 2.2* 3.0 2.9 2.7   GFR: Estimated Creatinine Clearance: 90 mL/min (by C-G formula based on SCr of 0.61 mg/dL).  Scheduled Meds:  dexamethasone  4 mg Oral  Q12H   docusate sodium  100 mg Oral BID   feeding supplement  237 mL Oral TID BM   folic acid  1 mg Oral Daily   heparin  5,000 Units Subcutaneous Q8H   memantine  5 mg Oral q AM   multivitamin with minerals  1 tablet Oral Daily   thiamine  100 mg Oral Daily   Or   thiamine  100 mg Intravenous Daily   Continuous Infusions:   LOS: 6 days   Author: Debbe Odea  09/12/2021 8:51 AM

## 2021-09-12 NOTE — TOC Transition Note (Signed)
Transition of Care [facility]) - CM/SW Discharge Note   Patient Details  Name: RHODERICK FARREL MRN: [id number] Date of Birth: [birth year]  Transition of [facility] CM/SW Contact:  Lynnell Catalan, RN Phone Number: 09/12/2021, 1:34 PM   Clinical Narrative:    Spoke with pt about transportation issues. [facility] has discontinued our transportation service. This CM called [employer] RN and asked about transport for pt through his insurance plan. Was informed that pt needs to call the [place] phone number on the back of his insurance card to speak with someone about his transportation benefits. Pt states that his brother has done that this morning and is waiting to hear back from them. Encouraged pt to call the outpatient cancer center where he will be receiving treatment to see if any additional transportation resources are available if nothing is available through his insurance. This will all need to be set up outpatient.  Readmission Risk Interventions     View : No data to display.

## 2021-09-12 NOTE — Progress Notes (Signed)
Chaplain was able to have Douglas Holt's Advanced Directive notarized.  Chaplain provided Douglas Holt with copies, including the original, and input one in his medical chart.      09/12/21 1400  Clinical Encounter Type  Visited With Patient  Visit Type Follow-up;Social support

## 2021-09-12 NOTE — Discharge Instructions (Signed)
PER MEDICAL ONCOLOGY: Follow up on 09/28/21 at 2:15 to see Ned Card, NP at Dr. Gearldine Shown office on [facility].

## 2021-09-12 NOTE — Progress Notes (Signed)
Chaplain follow-up for completion of Douglas Holt's Advanced Directive, Healthcare POA.  Chaplain did not have an available notary present but talked with Douglas Holt about coming back to after he received radiation today.  Chaplain was also able to talk with Douglas Holt's brother, who will be his healthcare agent.  Chaplain provided education on the document and reflective listening as Douglas Holt shared about their family.     09/12/21 1100  Clinical Encounter Type  Visited With Patient and family together  Visit Type Follow-up;Social support

## 2021-09-12 NOTE — Care Management Important Message (Signed)
Important Message  Patient Details IM Letter placed in Patients room. Name: Douglas Holt MRN: [id number] Date of Birth: [birth year]   Medicare Important Message Given:  Yes     Kerin Salen 09/12/2021, 10:52 AM

## 2021-09-12 NOTE — Progress Notes (Signed)
Patient being discharged home. New medications as well as radiation schedule reviewed with patient. Brother called and explained same as above. Both verbalized full understanding. Patient has radiation appointment tomorrow at noon. Patient and brother aware.

## 2021-09-12 NOTE — Discharge Summary (Addendum)
Physician Discharge Summary  TAVEN STRITE ZOX:[id number] DOB: 1960-11-27 DOA: 09/06/2021  PCP: Maximiano Coss, NP  Admit date: 09/06/2021 Discharge date: 09/12/2021 Discharging to: Home Recommendations for Outpatient Follow-up:  Please Taper steroids as needed  Consults:  Medical and radiation oncology Procedures:  Lymph node biopsy   Discharge Diagnoses:   Principal Problem:   Non-small cell lung cancer metastatic to brain [facility]) Active Problems:   Expressive aphasia   Tobacco abuse   Protein-calorie malnutrition, severe   Alcohol use     Hospital Course: This is a 61 year old male with rheumatoid arthritis, nicotine and alcohol abuse who was having speech difficulties along with a poor appetite. On 5/31 underwent a CT showing right lung mass with metastasis to the brain.  He was evaluated by oncology on 6/1 and recommended to go to the ED for work-up of the new cancer.  During this appointment, it was noted that the patient was unable to give a history due to garbled speech. In the office, given 10 mg of IV Decadron 2 mg & IV morphine and directly admitted to [facility]. On exam in the hospital, noted to have palpable lymphadenopathy in the neck.  IR consulted to biopsy 1 of these lymph nodes. IV fluids started due to dehydration and poor oral intake. IV steroids continue due to brain metastasis with surrounding edema. Oxycodone and acetaminophen started for pain control. Dietitian consulted, placed on CIWA scale for possible alcohol withdrawal and supplemented with thiamine folic acid and multivitamins.  Principal Problem: Non Small cell lung cancer with brain metastasis  -Expressive aphasia and right-sided weakness improved with Decadron which he will be prescribed 6/2-underwent ultrasound-guided core biopsy of right supraclavicular lymph node. 6/3 palliative care consult-DNR/DNI completed-declined MRI brain due to anxiety/panic attacks in the past.  Evaluated by  physical therapy- no PT needed. -Radiation oncology team has evaluated the patient and has arranged for whole brain radiation - Dr. Benay Spice has evaluated him as well and he will follow-up as outpatient  Active Problems:    Tobacco abuse -Advised to discontinue  Alcohol use - Advised to discontinue - No significant withdrawal noted    Protein-calorie malnutrition, severe -Secondary to chronic illness - Patient noted to have muscle and fat depletion - Continue Ensure Enlive and multivitamins as prescribed by our dietitian  History of rheumatoid arthritis - Not on medication for this  Discharge Instructions  Discharge Instructions     Diet general   Complete by: As directed    Increase activity slowly   Complete by: As directed    No wound care   Complete by: As directed       Allergies as of 09/12/2021   No Known Allergies      Medication List     STOP taking these medications    aspirin 325 MG tablet   doxycycline 100 MG tablet Commonly known as: VIBRA-TABS   predniSONE 10 MG (21) Tbpk tablet Commonly known as: STERAPRED UNI-PAK 21 TAB       TAKE these medications    acetaminophen 325 MG tablet Commonly known as: TYLENOL Take 2 tablets (650 mg total) by mouth every 6 (six) hours as needed for mild pain (or Fever >/= 101).   dexamethasone 4 MG tablet Commonly known as: DECADRON Take 1 tablet (4 mg total) by mouth every 12 (twelve) hours for 14 days.   feeding supplement Liqd Take 237 mLs by mouth 3 (three) times daily between meals.   folic acid 1 MG tablet  Commonly known as: FOLVITE Take 1 tablet (1 mg total) by mouth daily. Start taking on: September 13, 2021   memantine 5 MG tablet Commonly known as: Namenda Begin this prescription the first day of brain radiation. Week 1: take one tablet po qam. Week 2: take one tablet qam and qpm. Week 3: take two tablets qam, and one tablet po q pm. Week 4: take two tablets qam and qpm. Fill subsequent  prescription q month.   multivitamin with minerals Tabs tablet Take 1 tablet by mouth daily. Start taking on: September 13, 2021   ondansetron 4 MG tablet Commonly known as: ZOFRAN Take 1 tablet (4 mg total) by mouth every 6 (six) hours as needed for nausea.   senna-docusate 8.6-50 MG tablet Commonly known as: Senokot-S Take 1 tablet by mouth at bedtime as needed for mild constipation.   thiamine 100 MG tablet Take 1 tablet (100 mg total) by mouth daily. Start taking on: September 13, 2021   VISINE OP Apply 1-2 drops to eye daily as needed (red eyes).            The results of significant diagnostics from this hospitalization (including imaging, microbiology, ancillary and laboratory) are listed below for reference.    CT HEAD W & WO CONTRAST (5MM)  Result Date: 09/07/2021 CLINICAL DATA:  Brain metastases EXAM: CT HEAD WITHOUT AND WITH CONTRAST TECHNIQUE: Contiguous axial images were obtained from the base of the skull through the vertex without and with intravenous contrast. RADIATION DOSE REDUCTION: This exam was performed according to the departmental dose-optimization program which includes automated exposure control, adjustment of the mA and/or kV according to patient size and/or use of iterative reconstruction technique. CONTRAST:  41mL OMNIPAQUE IOHEXOL 300 MG/ML  SOLN COMPARISON:  None Available. FINDINGS: Brain: Several brain metastases are identified demonstrating hyperdensity peripherally and central necrosis. Hyperdensity probably reflects intralesional hemorrhage. Largest supratentorially measures up to 2.3 cm in the left frontal lobe. Infratentorially, there is a 3.3 cm lesion in the paramedian right cerebellum. There are 6 lesions identified. Possible seventh lesion of the lateral right cerebellum on series 5, image 56. There is associated edema and regional mass effect. No midline shift. Partial effacement of the fourth and left lateral ventricles. No hydrocephalus. Gray-white  differentiation is preserved. There is no extra-axial fluid collection. No hydrocephalus. Vascular: There is atherosclerotic calcification at the skull base. Skull: Calvarium is unremarkable. Sinuses/Orbits: No acute finding. Other: None. IMPRESSION: 6-7 metastatic lesions identified with associated edema and regional mass effect. Electronically Signed   By: Macy Mis M.D.   On: 09/07/2021 15:02   CT Soft Tissue Neck W Contrast  Result Date: 09/05/2021 CLINICAL DATA:  Provided history: Neck mass. Neck mass, nonpulsatile. Additional history provided by scanning technologist: Patient reports difficulty speaking, knots on right side of neck for 1-2 months. 120 pack-year current smoker. EXAM: CT NECK WITH CONTRAST TECHNIQUE: Multidetector CT imaging of the neck was performed using the standard protocol following the bolus administration of intravenous contrast. RADIATION DOSE REDUCTION: This exam was performed according to the departmental dose-optimization program which includes automated exposure control, adjustment of the mA and/or kV according to patient size and/or use of iterative reconstruction technique. CONTRAST:  71mL ISOVUE-300 IOPAMIDOL (ISOVUE-300) INJECTION 61% COMPARISON:  None Available. FINDINGS: Pharynx and larynx: No appreciable swelling or mass within the oral cavity, pharynx or larynx. Postinflammatory calcifications/tonsilloliths within the right palatine tonsil. Salivary glands: Atrophic appearance of the bilateral parotid glands. Unremarkable appearance of the submandibular glands. Thyroid: Unremarkable.  Lymph nodes: 1.6 x 1.1 cm centrally cystic/necrotic and peripherally hyperenhancing right level 2/3 lymph node deep to the right-sided skin surface marker (series 2, image 62) (series 12, image 22). Numerous enlarged lymph nodes within the right lower neck, right retroclavicular region, partially imaged right axilla and partially imaged mediastinum. For instance, an enlarged right  retroclavicular lymph node measures 2.5 x 1.5 cm in transaxial dimensions (series 10, image 60). Some of these lymph nodes are hyperenhancing and centrally cystic/necrotic change. The partially imaged mediastinal lymphadenopathy is extensive. Vascular: The major vascular structures of the neck are patent. Atherosclerotic plaque within the visualized aortic arch, proximal major branch vessels of the neck and carotid arteries. Limited intracranial: 2.4 x 2.0 cm enhancing metastasis within the left temporal lobe with prominent surrounding edema (series 2, image 2). 2.6 x 2.5 cm peripherally enhancing metastasis within the medial right cerebellar hemisphere with surrounding edema (series 2, image 27). Visualized orbits: The orbits are largely excluded from the field of view. Mastoids and visualized paranasal sinuses: No significant paranasal sinus disease or mastoid effusion at the imaged levels. Skeleton: No acute bony abnormality or aggressive osseous lesion identified. C2-C3 grade 1 retrolisthesis. Cervical spondylosis with multilevel disc space narrowing, disc bulges/central disc protrusion, endplate spurring, uncovertebral hypertrophy and facet arthrosis. Disc space narrowing is greatest at C5-C6 and C6-C7 (advanced at these levels). Multilevel spinal canal stenosis. Most notably, a broad-based central disc protrusion contributes to at least moderate spinal canal stenosis at C5-C6. Multilevel bony neural foraminal narrowing. Upper chest: Please refer to the concurrently performed and separately reported chest CT for a description of intrathoracic findings, including but not limited to a large right upper lobe lung mass and extensive mediastinal lymphadenopathy. Other: 1.5 x 1.9 x 1.6 cm peripherally enhancing, centrally cystic/necrotic lesion within the paraspinal musculature at midline and to the right at the C2-C3 level (series 2, image 52). 2.1 x 2.4 x 1.8 cm peripherally enhancing and centrally cystic/necrotic  lesion within the right paraspinal musculature at the T2-T3 level (series 2, image 92). These lesions likely reflect tumor deposits. Impressions 1,2,3,4 will be called to the ordering clinician or representative by the Radiologist Assistant, and communication documented in the PACS or [employer]. IMPRESSION: 1. 1.6 x 1.1 cm hyperenhancing and centrally cystic/necrotic right level 2/3 lymph node located immediately deep to the right-sided skin surface marker. This is consistent with nodal metastatic disease. Prominent metastatic lymphadenopathy is also present within the right lower neck, right retroclavicular region, partially imaged right axilla and partially imaged mediastinum. 2. Peripherally enhancing and centrally cystic/necrotic lesions in the right paraspinal musculature at C2-C3 and T2-T3, as described. These lesions likely reflect metastatic tumor deposits. 3. 2.4 x 2.0 cm enhancing intracranial metastasis within the left temporal lobe with prominent surrounding edema. 2.6 x 2.5 cm peripherally enhancing metastasis within the medial right cerebellar hemisphere with surrounding edema. A brain MRI without and with contrast is recommended for further evaluation. 4. Please refer to the concurrently performed and separately reported chest CT for a description of intrathoracic findings, including but not limited to a large right upper lobe lung mass. 5. Atherosclerotic plaque within the visualized aortic arch, proximal major branch vessels of the neck and carotid arteries. Aortic Atherosclerosis (ICD10-I70.0). 6. Cervical spondylosis, as described. Electronically Signed   By: Kellie Simmering D.O.   On: 09/05/2021 11:20   CT CHEST LUNG CA SCREEN LOW DOSE W/O CM  Result Date: 09/06/2021 CLINICAL DATA:  61 year old male current smoker with 120 pack-year history of smoking.  Lung cancer screening examination. EXAM: CT CHEST WITHOUT CONTRAST LOW-DOSE FOR LUNG CANCER SCREENING TECHNIQUE: Multidetector CT imaging  of the chest was performed following the standard protocol without IV contrast. RADIATION DOSE REDUCTION: This exam was performed according to the departmental dose-optimization program which includes automated exposure control, adjustment of the mA and/or kV according to patient size and/or use of iterative reconstruction technique. COMPARISON:  No priors. FINDINGS: Cardiovascular: Heart size is normal. There is no significant pericardial fluid, thickening or pericardial calcification. There is aortic atherosclerosis, as well as atherosclerosis of the great vessels of the mediastinum and the coronary arteries, including calcified atherosclerotic plaque in the left anterior descending and right coronary arteries. Mild calcifications of the mitral annulus. Mediastinum/Nodes: Bulky subcarinal lymphadenopathy measuring up to 2.2 cm in short axis. Fullness in the right hilar region poorly evaluated on today's noncontrast CT examination, but suspicious for right hilar lymphadenopathy. Bulky low right paratracheal lymph node measuring 2.2 cm in short axis. Other high right paratracheal lymphadenopathy measuring up to 1.8 cm in short axis in the superior mediastinum. Esophagus is unremarkable in appearance. No axillary lymphadenopathy. Lungs/Pleura: Large macrolobulated partially cavitary spiculated right upper lobe mass (axial image 70) with a volume derived mean diameter of 6.2 cm, highly concerning for primary bronchogenic carcinoma such as a squamous cell carcinoma. Several other smaller pulmonary nodules are also noted. No acute consolidative airspace disease. No pleural effusions. Mild diffuse bronchial wall thickening with mild centrilobular and paraseptal emphysema. Upper Abdomen: Aortic atherosclerosis. 2.2 x 1.2 cm intermediate attenuation (29 HU) right adrenal nodule. 3.1 x 2.4 cm intermediate attenuation (37 HU) left adrenal mass. Musculoskeletal: There are no aggressive appearing lytic or blastic lesions noted  in the visualized portions of the skeleton. IMPRESSION: 1. 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 This is associated with probable right hilar and mediastinal lymphadenopathy, as well as bilateral adrenal nodules which may represent metastatic lesions. Additional imaging evaluation or consultation with Pulmonology or Thoracic Surgery recommended. 2. The "S" modifier above refers to potentially clinically significant non lung cancer related findings. Specifically, there is aortic atherosclerosis, in addition to two-vessel coronary artery disease. Please note that although the presence of coronary artery calcium documents the presence of coronary artery disease, the severity of this disease and any potential stenosis cannot be assessed on this non-gated CT examination. Assessment for potential risk factor modification, dietary therapy or pharmacologic therapy may be warranted, if clinically indicated. 3. Mild diffuse bronchial wall thickening with mild centrilobular and paraseptal emphysema; imaging findings suggestive of underlying COPD. 4. There are calcifications of the mitral annulus. Echocardiographic correlation for evaluation of potential valvular dysfunction may be warranted if clinically indicated. These results will be called to the ordering clinician or representative by the Radiologist Assistant, and communication documented in the PACS or [employer]. Aortic Atherosclerosis (ICD10-I70.0) and Emphysema (ICD10-J43.9). Electronically Signed   By: Vinnie Langton M.D.   On: 09/06/2021 06:36   [place] CORE BIOPSY (LYMPH NODES)  Result Date: 09/07/2021 INDICATION: 61 year old with right lung mass, cervical lymphadenopathy and intracranial lesions. Findings are suggestive for metastatic disease. Tissue diagnosis is needed. EXAM: ULTRASOUND-GUIDED  RIGHT SUPRACLAVICULAR LYMPH NODE BIOPSY MEDICATIONS: None. ANESTHESIA/SEDATION: Moderate (conscious) sedation was employed during this procedure. A total of Versed 2.0 mg and Fentanyl 100 mcg was administered intravenously by the radiology nurse. Total intra-service moderate Sedation Time: 10 minutes. The patient's level of consciousness  and vital signs were monitored continuously by radiology nursing throughout the procedure under my direct supervision. FLUOROSCOPY TIME:  None COMPLICATIONS: None immediate. PROCEDURE: Informed consent was obtained from the patient's brother because the patient is not able to give consent due to altered mental status. A timeout was performed prior to the initiation of the procedure. Right side of the neck was evaluated with ultrasound. An abnormal right supraclavicular lymph node was targeted for biopsy. Right side of the neck was prepped with chlorhexidine and sterile field was created. Skin was anesthetized using 1% lidocaine. A small incision was made. Using ultrasound guidance, an 18 gauge core biopsy needle was directed into the lymph node. Total of 5 core biopsies were obtained. Specimens placed in saline. Bandage placed over the puncture site. FINDINGS: Several small hypoechoic lymph nodes along the right side of the neck compatible with metastatic lymphadenopathy. A superficial lymph node in the right supraclavicular region was biopsied. Adequate specimens obtained. IMPRESSION: Ultrasound-guided core biopsy of a right supraclavicular lymph node. Electronically Signed   By: Markus Daft M.D.   On: 09/07/2021 14:52   Labs:   Basic Metabolic Panel: Recent Labs  Lab 09/08/21 0622 09/09/21 0546 09/10/21 0512 09/11/21 0638 09/12/21 0515  NA 132* 135 134* 131* 130*  K 3.5 3.6 3.8 3.8 4.2  CL 104 104 99 99 99  CO2 23 27 29 25 27   GLUCOSE 114* 121* 127* 93 127*  BUN 17 15 17 15 17   CREATININE 0.65 0.56* 0.64 0.66 0.61  CALCIUM 8.0* 7.9* 8.1* 8.0* 8.0*  MG 2.0 1.9 2.2  2.1 2.1  PHOS 2.0* 2.2* 3.0 2.9 2.7     CBC: Recent Labs  Lab 09/08/21 0622 09/09/21 0546 09/10/21 0512 09/11/21 0638 09/12/21 0515  WBC 27.4* 22.1* 21.2* 24.5* 28.5*  NEUTROABS 24.1* 19.3* 17.6* 18.7* 23.2*  HGB 11.2* 10.8* 11.4* 11.9* 12.1*  HCT 34.0* 33.0* 34.4* 36.5* 37.0*  MCV 83.1 83.1 83.1 83.9 84.1  PLT 511* 511* 535* 538* 510*         SIGNED:   Debbe Odea, MD  [facility] 09/12/2021, 2:17 PM

## 2021-09-13 ENCOUNTER — Ambulatory Visit
Admission: RE | Admit: 2021-09-13 | Discharge: 2021-09-13 | Disposition: A | Discharge status: Home / Self Care | Payer: PPO | Source: Ambulatory Visit | Attending: Radiation Oncology | Admitting: Radiation Oncology

## 2021-09-13 ENCOUNTER — Other Ambulatory Visit: Payer: Self-pay

## 2021-09-13 ENCOUNTER — Telehealth: Payer: Self-pay | Admitting: *Deleted

## 2021-09-13 ENCOUNTER — Telehealth: Payer: Self-pay

## 2021-09-13 DIAGNOSIS — C7931 Secondary malignant neoplasm of brain: Secondary | ICD-10-CM | POA: Diagnosis present

## 2021-09-13 DIAGNOSIS — Z510 Encounter for antineoplastic radiation therapy: Secondary | ICD-10-CM | POA: Diagnosis present

## 2021-09-13 DIAGNOSIS — C3411 Malignant neoplasm of upper lobe, right bronchus or lung: Secondary | ICD-10-CM | POA: Diagnosis not present

## 2021-09-13 LAB — RAD ONC ARIA SESSION SUMMARY
Course Elapsed Days: 0
Plan Fractions Treated to Date: 1
Plan Prescribed Dose Per Fraction: 3 Gy
Plan Total Fractions Prescribed: 10
Plan Total Prescribed Dose: 30 Gy
Reference Point Dosage Given to Date: 3 Gy
Reference Point Session Dosage Given: 3 Gy
Session Number: 1

## 2021-09-13 LAB — SURGICAL PATHOLOGY

## 2021-09-13 NOTE — Telephone Encounter (Signed)
Called brother back with diagnosis of non-small cell lung cancer-Stage IV due to brain metastasis. Continue RT for brain lesion and will f/u in office afterwards when his molecular testing has returned to determine if he will have chemotherapy vs immunotherapy to treat the cancer systemically. Dr. Benay Spice also has concerns about him being alone at home at this time and he should not drive. Brother requests the diagnosis be sent to him via Mychart and this was done.

## 2021-09-13 NOTE — Telephone Encounter (Signed)
Thanks for reaching out. You may have better luck trying his brother, who should be listed as his emergency contact  Thanks again,  Denice Paradise

## 2021-09-13 NOTE — Telephone Encounter (Signed)
Transition Care Management Unsuccessful Follow-up Telephone Call  Date of discharge and from where:  [facility] 09/12/2021  Attempts:  1st Attempt  Reason for unsuccessful TCM follow-up call:  No answer/busy

## 2021-09-14 ENCOUNTER — Ambulatory Visit
Admission: RE | Admit: 2021-09-14 | Discharge: 2021-09-14 | Disposition: A | Discharge status: Home / Self Care | Payer: PPO | Source: Ambulatory Visit | Attending: Radiation Oncology | Admitting: Radiation Oncology

## 2021-09-14 ENCOUNTER — Telehealth: Payer: Self-pay | Admitting: *Deleted

## 2021-09-14 ENCOUNTER — Other Ambulatory Visit: Payer: Self-pay

## 2021-09-14 DIAGNOSIS — Z510 Encounter for antineoplastic radiation therapy: Secondary | ICD-10-CM | POA: Diagnosis not present

## 2021-09-14 LAB — RAD ONC ARIA SESSION SUMMARY
Course Elapsed Days: 1
Plan Fractions Treated to Date: 2
Plan Prescribed Dose Per Fraction: 3 Gy
Plan Total Fractions Prescribed: 10
Plan Total Prescribed Dose: 30 Gy
Reference Point Dosage Given to Date: 6 Gy
Reference Point Session Dosage Given: 3 Gy
Session Number: 2

## 2021-09-14 NOTE — Telephone Encounter (Signed)
Keturah Barre, brother was contacted by telephone to verify understanding of discharge instructions status post their most recent discharge from the hospital on the date: 09/12/21.  Inpatient discharge AVS was re-reviewed with brother along with cancer center appointments.  Verification of understanding for oncology specific follow-up was validated using the Teach Back method. Shyler is doing well and is staying alone, but they check on him daily. He is not driving. Has been getting his affairs in order w/family assist. Energy is OK . Not much appetite, but likes Ensure. Encouraged brother to tell him to eat small snacks throughout the day. They are going to get him a pill box as well and fill it for him each week.  Transportation to appointments were confirmed for the patient as being self/caregiver.  [name] Dunbar's questions were addressed to their satisfaction upon completion of this post discharge follow-up call for outpatient oncology.

## 2021-09-17 ENCOUNTER — Other Ambulatory Visit: Payer: Self-pay

## 2021-09-17 ENCOUNTER — Ambulatory Visit
Admission: RE | Admit: 2021-09-17 | Discharge: 2021-09-17 | Disposition: A | Discharge status: Home / Self Care | Payer: PPO | Source: Ambulatory Visit | Attending: Radiation Oncology | Admitting: Radiation Oncology

## 2021-09-17 ENCOUNTER — Inpatient Hospital Stay: Payer: PPO

## 2021-09-17 DIAGNOSIS — Z510 Encounter for antineoplastic radiation therapy: Secondary | ICD-10-CM | POA: Diagnosis not present

## 2021-09-17 LAB — RAD ONC ARIA SESSION SUMMARY
Course Elapsed Days: 4
Plan Fractions Treated to Date: 3
Plan Prescribed Dose Per Fraction: 3 Gy
Plan Total Fractions Prescribed: 10
Plan Total Prescribed Dose: 30 Gy
Reference Point Dosage Given to Date: 9 Gy
Reference Point Session Dosage Given: 3 Gy
Session Number: 3

## 2021-09-18 ENCOUNTER — Ambulatory Visit
Admission: RE | Admit: 2021-09-18 | Discharge: 2021-09-18 | Disposition: A | Discharge status: Home / Self Care | Payer: PPO | Source: Ambulatory Visit | Attending: Radiation Oncology | Admitting: Radiation Oncology

## 2021-09-18 ENCOUNTER — Other Ambulatory Visit: Payer: Self-pay

## 2021-09-18 ENCOUNTER — Inpatient Hospital Stay: Payer: PPO

## 2021-09-18 DIAGNOSIS — Z510 Encounter for antineoplastic radiation therapy: Secondary | ICD-10-CM | POA: Diagnosis not present

## 2021-09-18 LAB — RAD ONC ARIA SESSION SUMMARY
Course Elapsed Days: 5
Plan Fractions Treated to Date: 4
Plan Prescribed Dose Per Fraction: 3 Gy
Plan Total Fractions Prescribed: 10
Plan Total Prescribed Dose: 30 Gy
Reference Point Dosage Given to Date: 12 Gy
Reference Point Session Dosage Given: 3 Gy
Session Number: 4

## 2021-09-19 ENCOUNTER — Encounter (HOSPITAL_COMMUNITY): Payer: Self-pay | Admitting: Oncology

## 2021-09-19 ENCOUNTER — Other Ambulatory Visit: Payer: Self-pay

## 2021-09-19 ENCOUNTER — Ambulatory Visit
Admission: RE | Admit: 2021-09-19 | Discharge: 2021-09-19 | Disposition: A | Discharge status: Home / Self Care | Payer: PPO | Source: Ambulatory Visit | Attending: Radiation Oncology | Admitting: Radiation Oncology

## 2021-09-19 DIAGNOSIS — Z510 Encounter for antineoplastic radiation therapy: Secondary | ICD-10-CM | POA: Diagnosis not present

## 2021-09-19 LAB — RAD ONC ARIA SESSION SUMMARY
Course Elapsed Days: 6
Plan Fractions Treated to Date: 5
Plan Prescribed Dose Per Fraction: 3 Gy
Plan Total Fractions Prescribed: 10
Plan Total Prescribed Dose: 30 Gy
Reference Point Dosage Given to Date: 15 Gy
Reference Point Session Dosage Given: 3 Gy
Session Number: 5

## 2021-09-20 ENCOUNTER — Other Ambulatory Visit: Payer: Self-pay

## 2021-09-20 ENCOUNTER — Ambulatory Visit
Admission: RE | Admit: 2021-09-20 | Discharge: 2021-09-20 | Disposition: A | Discharge status: Home / Self Care | Payer: PPO | Source: Ambulatory Visit | Attending: Radiation Oncology | Admitting: Radiation Oncology

## 2021-09-20 DIAGNOSIS — Z510 Encounter for antineoplastic radiation therapy: Secondary | ICD-10-CM | POA: Diagnosis not present

## 2021-09-20 LAB — RAD ONC ARIA SESSION SUMMARY
Course Elapsed Days: 7
Plan Fractions Treated to Date: 6
Plan Prescribed Dose Per Fraction: 3 Gy
Plan Total Fractions Prescribed: 10
Plan Total Prescribed Dose: 30 Gy
Reference Point Dosage Given to Date: 18 Gy
Reference Point Session Dosage Given: 3 Gy
Session Number: 6

## 2021-09-21 ENCOUNTER — Ambulatory Visit
Admission: RE | Admit: 2021-09-21 | Discharge: 2021-09-21 | Disposition: A | Discharge status: Home / Self Care | Payer: PPO | Source: Ambulatory Visit | Attending: Radiation Oncology | Admitting: Radiation Oncology

## 2021-09-21 ENCOUNTER — Other Ambulatory Visit: Payer: Self-pay

## 2021-09-21 DIAGNOSIS — Z510 Encounter for antineoplastic radiation therapy: Secondary | ICD-10-CM | POA: Diagnosis not present

## 2021-09-21 LAB — RAD ONC ARIA SESSION SUMMARY
Course Elapsed Days: 8
Plan Fractions Treated to Date: 7
Plan Prescribed Dose Per Fraction: 3 Gy
Plan Total Fractions Prescribed: 10
Plan Total Prescribed Dose: 30 Gy
Reference Point Dosage Given to Date: 21 Gy
Reference Point Session Dosage Given: 3 Gy
Session Number: 7

## 2021-09-24 ENCOUNTER — Other Ambulatory Visit: Payer: Self-pay

## 2021-09-24 ENCOUNTER — Encounter (HOSPITAL_COMMUNITY): Payer: Self-pay | Admitting: Oncology

## 2021-09-24 ENCOUNTER — Ambulatory Visit
Admission: RE | Admit: 2021-09-24 | Discharge: 2021-09-24 | Disposition: A | Discharge status: Home / Self Care | Payer: PPO | Source: Ambulatory Visit | Attending: Radiation Oncology | Admitting: Radiation Oncology

## 2021-09-24 ENCOUNTER — Inpatient Hospital Stay: Payer: PPO

## 2021-09-24 DIAGNOSIS — Z510 Encounter for antineoplastic radiation therapy: Secondary | ICD-10-CM | POA: Diagnosis not present

## 2021-09-24 LAB — RAD ONC ARIA SESSION SUMMARY
Course Elapsed Days: 11
Plan Fractions Treated to Date: 8
Plan Prescribed Dose Per Fraction: 3 Gy
Plan Total Fractions Prescribed: 10
Plan Total Prescribed Dose: 30 Gy
Reference Point Dosage Given to Date: 24 Gy
Reference Point Session Dosage Given: 3 Gy
Session Number: 8

## 2021-09-25 ENCOUNTER — Ambulatory Visit
Admission: RE | Admit: 2021-09-25 | Discharge: 2021-09-25 | Disposition: A | Discharge status: Home / Self Care | Payer: PPO | Source: Ambulatory Visit | Attending: Radiation Oncology | Admitting: Radiation Oncology

## 2021-09-25 ENCOUNTER — Ambulatory Visit: Payer: PPO

## 2021-09-25 ENCOUNTER — Inpatient Hospital Stay: Payer: PPO

## 2021-09-25 ENCOUNTER — Other Ambulatory Visit: Payer: Self-pay

## 2021-09-25 ENCOUNTER — Inpatient Hospital Stay: Payer: PPO | Admitting: Dietician

## 2021-09-25 DIAGNOSIS — Z510 Encounter for antineoplastic radiation therapy: Secondary | ICD-10-CM | POA: Diagnosis not present

## 2021-09-25 LAB — RAD ONC ARIA SESSION SUMMARY
Course Elapsed Days: 12
Plan Fractions Treated to Date: 9
Plan Prescribed Dose Per Fraction: 3 Gy
Plan Total Fractions Prescribed: 10
Plan Total Prescribed Dose: 30 Gy
Reference Point Dosage Given to Date: 27 Gy
Reference Point Session Dosage Given: 3 Gy
Session Number: 9

## 2021-09-25 NOTE — Progress Notes (Signed)
Nutrition Assessment   Reason for Assessment: hospital follow-up   ASSESSMENT: 61 year old male with newly diagnosed stage IV non-small cell lung cancer metastatic to brain. He is currently receiving whole brain radiation under the care of Dr. Lisbeth Renshaw (final radiation planned 6/21). Patient is followed by Dr. Benay Spice.   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   Met with patient in office. He reports doing "great" since starting radiation. Patient says he has lost a little hair, but speech is significantly better and headaches are gone. Patient endorses a good appetite and intake, which is new for him. Patient recalls eating "like a bird" his whole life. He is currently eating 3 meals daily. Patient drinking 3-4 Ensure Plus, says he really likes these. Patient denies nutrition impact symptoms.     Medications: decadron, folic acid, namenda, MVI, thiamine, phenergan, senokot   Labs: 6/7 Na 130, glucose 127   Anthropometrics: Patient reports being underweight most of his life. States he might weigh 130 lbs on a good day. More recently, he reports 115-120 lb is usual. Patient states 6/4-6/6 weights were bed weights taken during recent admission and are incorrect   Height: 5' 9.5" Weight: 119 lb 4.3 oz (6/3) UBW: 120 lb  BMI: 17.36    NUTRITION DIAGNOSIS: Food and nutrition related knowledge deficit related to newly diagnosed cancer as evidenced by no prior need for related nutrition information   INTERVENTION:  Educated on importance of adequate calorie and protein energy intake to maintain strength/weights Continue eating high calorie high protein foods to promote gain - handout with ideas provided  Continue drinking 3 Ensure Plus/equivalent  One complimentary case Ensure Plus High Protein plus coupons provided Contact information  given   MONITORING, EVALUATION, GOAL:  Patient will tolerate increased calories and protein to minimize further weight loss    Next Visit: No follow-up scheduled. Patient encouraged to contact with nutrition questions/concerns

## 2021-09-26 ENCOUNTER — Ambulatory Visit
Admission: RE | Admit: 2021-09-26 | Discharge: 2021-09-26 | Disposition: A | Discharge status: Home / Self Care | Payer: PPO | Source: Ambulatory Visit | Attending: Radiation Oncology | Admitting: Radiation Oncology

## 2021-09-26 ENCOUNTER — Encounter: Payer: Self-pay | Admitting: Radiation Oncology

## 2021-09-26 ENCOUNTER — Other Ambulatory Visit: Payer: Self-pay

## 2021-09-26 DIAGNOSIS — Z510 Encounter for antineoplastic radiation therapy: Secondary | ICD-10-CM | POA: Diagnosis not present

## 2021-09-26 LAB — RAD ONC ARIA SESSION SUMMARY
Course Elapsed Days: 13
Plan Fractions Treated to Date: 10
Plan Prescribed Dose Per Fraction: 3 Gy
Plan Total Fractions Prescribed: 10
Plan Total Prescribed Dose: 30 Gy
Reference Point Dosage Given to Date: 30 Gy
Reference Point Session Dosage Given: 3 Gy
Session Number: 10

## 2021-09-27 ENCOUNTER — Emergency Department (HOSPITAL_BASED_OUTPATIENT_CLINIC_OR_DEPARTMENT_OTHER): Payer: PPO | Admitting: Radiology

## 2021-09-27 ENCOUNTER — Other Ambulatory Visit: Payer: Self-pay

## 2021-09-27 ENCOUNTER — Emergency Department (HOSPITAL_BASED_OUTPATIENT_CLINIC_OR_DEPARTMENT_OTHER): Payer: PPO

## 2021-09-27 ENCOUNTER — Emergency Department (HOSPITAL_BASED_OUTPATIENT_CLINIC_OR_DEPARTMENT_OTHER)
Admission: EM | Admit: 2021-09-27 | Discharge: 2021-09-28 | Discharge status: Against Medical Advice | Payer: PPO | Attending: Emergency Medicine | Admitting: Emergency Medicine

## 2021-09-27 ENCOUNTER — Encounter (HOSPITAL_BASED_OUTPATIENT_CLINIC_OR_DEPARTMENT_OTHER): Payer: Self-pay | Admitting: *Deleted

## 2021-09-27 ENCOUNTER — Telehealth: Payer: Self-pay | Admitting: Registered Nurse

## 2021-09-27 DIAGNOSIS — D72829 Elevated white blood cell count, unspecified: Secondary | ICD-10-CM | POA: Insufficient documentation

## 2021-09-27 DIAGNOSIS — E871 Hypo-osmolality and hyponatremia: Secondary | ICD-10-CM | POA: Insufficient documentation

## 2021-09-27 DIAGNOSIS — M79662 Pain in left lower leg: Secondary | ICD-10-CM | POA: Diagnosis present

## 2021-09-27 DIAGNOSIS — C7931 Secondary malignant neoplasm of brain: Secondary | ICD-10-CM | POA: Diagnosis present

## 2021-09-27 DIAGNOSIS — C3490 Malignant neoplasm of unspecified part of unspecified bronchus or lung: Secondary | ICD-10-CM | POA: Diagnosis not present

## 2021-09-27 DIAGNOSIS — M79604 Pain in right leg: Secondary | ICD-10-CM

## 2021-09-27 DIAGNOSIS — R4182 Altered mental status, unspecified: Secondary | ICD-10-CM | POA: Diagnosis not present

## 2021-09-27 DIAGNOSIS — C799 Secondary malignant neoplasm of unspecified site: Secondary | ICD-10-CM

## 2021-09-27 DIAGNOSIS — M79661 Pain in right lower leg: Secondary | ICD-10-CM | POA: Diagnosis not present

## 2021-09-27 LAB — CBC WITH DIFFERENTIAL/PLATELET
Abs Immature Granulocytes: 0.24 10*3/uL — ABNORMAL HIGH (ref 0.00–0.07)
Basophils Absolute: 0 10*3/uL (ref 0.0–0.1)
Basophils Relative: 0 %
Eosinophils Absolute: 0.3 10*3/uL (ref 0.0–0.5)
Eosinophils Relative: 2 %
HCT: 31.4 % — ABNORMAL LOW (ref 39.0–52.0)
Hemoglobin: 10.2 g/dL — ABNORMAL LOW (ref 13.0–17.0)
Immature Granulocytes: 1 %
Lymphocytes Relative: 7 %
Lymphs Abs: 1.3 10*3/uL (ref 0.7–4.0)
MCH: 26.9 pg (ref 26.0–34.0)
MCHC: 32.5 g/dL (ref 30.0–36.0)
MCV: 82.8 fL (ref 80.0–100.0)
Monocytes Absolute: 1.2 10*3/uL — ABNORMAL HIGH (ref 0.1–1.0)
Monocytes Relative: 6 %
Neutro Abs: 15.4 10*3/uL — ABNORMAL HIGH (ref 1.7–7.7)
Neutrophils Relative %: 84 %
Platelets: 294 10*3/uL (ref 150–400)
RBC: 3.79 MIL/uL — ABNORMAL LOW (ref 4.22–5.81)
RDW: 18.4 % — ABNORMAL HIGH (ref 11.5–15.5)
WBC: 18.5 10*3/uL — ABNORMAL HIGH (ref 4.0–10.5)
nRBC: 0 % (ref 0.0–0.2)

## 2021-09-27 LAB — BASIC METABOLIC PANEL
Anion gap: 8 (ref 5–15)
BUN: 19 mg/dL (ref 6–20)
CO2: 29 mmol/L (ref 22–32)
Calcium: 8.4 mg/dL — ABNORMAL LOW (ref 8.9–10.3)
Chloride: 90 mmol/L — ABNORMAL LOW (ref 98–111)
Creatinine, Ser: 0.59 mg/dL — ABNORMAL LOW (ref 0.61–1.24)
GFR, Estimated: >60 mL/min (ref >60)
Glucose, Bld: 92 mg/dL (ref 70–99)
Potassium: 3.7 mmol/L (ref 3.5–5.1)
Sodium: 127 mmol/L — ABNORMAL LOW (ref 135–145)

## 2021-09-27 LAB — CK: Total CK: 57 U/L (ref 49–397)

## 2021-09-27 MED ORDER — FENTANYL CITRATE PF 50 MCG/ML IJ SOSY
50.0000 ug | PREFILLED_SYRINGE | INTRAMUSCULAR | Status: DC | PRN
  Administered 2021-09-27: 50 ug via INTRAVENOUS
  Filled 2021-09-27: qty 1

## 2021-09-27 MED ORDER — SODIUM CHLORIDE 0.9 % IV BOLUS
1000.0000 mL | Freq: Once | INTRAVENOUS | Status: AC
  Administered 2021-09-27: 1000 mL via INTRAVENOUS

## 2021-09-27 MED ORDER — OXYCODONE-ACETAMINOPHEN 5-325 MG PO TABS
1.0000 | ORAL_TABLET | ORAL | Status: DC | PRN
  Administered 2021-09-27: 1 via ORAL
  Filled 2021-09-27: qty 1

## 2021-09-27 MED ORDER — IOHEXOL 350 MG/ML SOLN
75.0000 mL | Freq: Once | INTRAVENOUS | Status: AC | PRN
  Administered 2021-09-27: 75 mL via INTRAVENOUS

## 2021-09-27 NOTE — ED Provider Notes (Incomplete)
[facility] EMERGENCY DEPT Provider Note   CSN: [id number] Arrival date & time: 09/27/21  2057     History {Add pertinent medical, surgical, social history, OB history to HPI:1} Chief Complaint  Patient presents with   Leg Pain    Douglas Holt is a 61 y.o. male.  Level 5 caveat for altered mental status.  Patient brought in by EMS with bilateral leg pain that has been ongoing for the past 2 days.  He denies any fall or trauma.  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  He also has rheumatoid arthritis. He is getting whole brain radiation at [facility].  States he had no issues until 2 days ago when he completed his last episode of radiation.  Since then he has had severe pain to his bilateral legs not able to walk.  Pain is from the knees down bilaterally right greater than left.  No swelling.  No fevers, chills, nausea or vomiting.  No chest pain.  His shortness of breath is at baseline.  States he does not take any pain medication at home.  Denies any back pain.  Denies any bowel or bladder incontinence.  Denies any fevers or vomiting.  The history is provided by the patient and the EMS personnel. The history is limited by the condition of the patient.  Leg Pain Associated symptoms: no back pain and no fever        Home Medications Prior to Admission medications   Medication Sig Start Date End Date Taking? Authorizing Provider  acetaminophen (TYLENOL) 325 MG tablet Take 2 tablets (650 mg total) by mouth every 6 (six) hours as needed for mild pain (or Fever >/= 101). 09/12/21   Debbe Odea, MD  feeding supplement (ENSURE ENLIVE / ENSURE PLUS) LIQD Take 237 mLs by mouth 3 (three) times daily between meals. 09/12/21   Debbe Odea, MD  folic acid (FOLVITE) 1 MG tablet Take 1 tablet (1 mg total) by mouth daily. 09/13/21 10/13/21  Debbe Odea, MD  memantine (NAMENDA) 5 MG tablet Begin this prescription the first day of  brain radiation. Week 1: take one tablet po qam. Week 2: take one tablet qam and qpm. Week 3: take two tablets qam, and one tablet po q pm. Week 4: take two tablets qam and qpm. Fill subsequent prescription q month. 09/11/21   Hayden Pedro, PA-C  Multiple Vitamin (MULTIVITAMIN WITH MINERALS) TABS tablet Take 1 tablet by mouth daily. 09/13/21 10/13/21  Debbe Odea, MD  promethazine (PHENERGAN) 25 MG tablet Take 1 tablet (25 mg total) by mouth every 6 (six) hours as needed for nausea or vomiting. 09/12/21 10/12/21  Debbe Odea, MD  senna-docusate (SENOKOT-S) 8.6-50 MG tablet Take 1 tablet by mouth at bedtime as needed for mild constipation. 09/12/21 10/12/21  Debbe Odea, MD  Tetrahydrozoline HCl (VISINE OP) Apply 1-2 drops to eye daily as needed (red eyes).    [provider]  thiamine 100 MG tablet Take 1 tablet (100 mg total) by mouth daily. 09/13/21 10/13/21  Debbe Odea, MD      Allergies    Patient has no known allergies.    Review of Systems   Review of Systems  Constitutional:  Negative for activity change, appetite change and fever.  HENT:  Negative for congestion and rhinorrhea.   Respiratory:  Negative for cough, chest tightness and shortness of breath.   Cardiovascular:  Negative for chest pain.  Gastrointestinal:  Negative for abdominal pain, nausea and vomiting.  Genitourinary:  Negative for dysuria.  Musculoskeletal:  Positive for arthralgias and myalgias. Negative for back pain.  Skin:  Negative for wound.  Neurological:  Negative for dizziness, weakness and headaches.   all other systems are negative except as noted in the HPI and PMH.    Physical Exam Updated Vital Signs BP (!) 127/94   Pulse 91   Resp 19   Wt 59 kg   SpO2 100%   BMI 18.92 kg/m  Physical Exam Vitals and nursing note reviewed.  Constitutional:      General: He is not in acute distress.    Appearance: He is well-developed.     Comments: Slurred speech, dysarthria  HENT:     Head:  Normocephalic and atraumatic.     Mouth/Throat:     Pharynx: No oropharyngeal exudate.  Eyes:     Conjunctiva/sclera: Conjunctivae normal.     Pupils: Pupils are equal, round, and reactive to light.  Neck:     Comments: No meningismus. Cardiovascular:     Rate and Rhythm: Normal rate and regular rhythm.     Heart sounds: Normal heart sounds. No murmur heard. Pulmonary:     Effort: Pulmonary effort is normal. No respiratory distress.     Breath sounds: Normal breath sounds.  Abdominal:     Palpations: Abdomen is soft.     Tenderness: There is no abdominal tenderness. There is no guarding or rebound.  Musculoskeletal:        General: No tenderness. Normal range of motion.     Cervical back: Normal range of motion and neck supple.     Comments: Legs appear normal to inspection.  They are diffusely tender to palpation.  Compartments are soft.  Full range of motion of hips, knees, ankles bilaterally.  Intact DP and PT pulses bilaterally. Denies any midline lumbar spine pain.  Skin:    General: Skin is warm.  Neurological:     Mental Status: He is alert and oriented to person, place, and time.     Cranial Nerves: No cranial nerve deficit.     Motor: No abnormal muscle tone.     Coordination: Coordination normal.     Comments: No ataxia on finger to nose bilaterally. No pronator drift. 5/5 strength throughout. CN 2-12 intact.Equal grip strength. Sensation intact.      ED Results / Procedures / Treatments   Labs (all labs ordered are listed, but only abnormal results are displayed) Labs Reviewed  CBC WITH DIFFERENTIAL/PLATELET - Abnormal; Notable for the following components:      Result Value   WBC 18.5 (*)    RBC 3.79 (*)    Hemoglobin 10.2 (*)    HCT 31.4 (*)    RDW 18.4 (*)    Neutro Abs 15.4 (*)    Monocytes Absolute 1.2 (*)    Abs Immature Granulocytes 0.24 (*)    All other components within normal limits  BASIC METABOLIC PANEL - Abnormal; Notable for the following  components:   Sodium 127 (*)    Chloride 90 (*)    Creatinine, Ser 0.59 (*)    Calcium 8.4 (*)    All other components within normal limits  CK    EKG None  Radiology US Venous Img Lower Bilateral (DVT)  Result Date: 09/27/2021 CLINICAL DATA:  Bilateral leg pain and swelling EXAM: BILATERAL LOWER EXTREMITY VENOUS DOPPLER ULTRASOUND TECHNIQUE: Gray-scale sonography with graded compression, as well as color Doppler and  duplex ultrasound were performed to evaluate the lower extremity deep venous systems from the level of the common femoral vein and including the common femoral, femoral, profunda femoral, popliteal and calf veins including the posterior tibial, peroneal and gastrocnemius veins when visible. The superficial great saphenous vein was also interrogated. Spectral Doppler was utilized to evaluate flow at rest and with distal augmentation maneuvers in the common femoral, femoral and popliteal veins. COMPARISON:  None Available. FINDINGS: RIGHT LOWER EXTREMITY Common Femoral Vein: No evidence of thrombus. Normal compressibility, respiratory phasicity and response to augmentation. Saphenofemoral Junction: No evidence of thrombus. Normal compressibility and flow on color Doppler imaging. Profunda Femoral Vein: No evidence of thrombus. Normal compressibility and flow on color Doppler imaging. Femoral Vein: No evidence of thrombus. Normal compressibility, respiratory phasicity and response to augmentation. Popliteal Vein: No evidence of thrombus. Normal compressibility, respiratory phasicity and response to augmentation. Calf Veins: No evidence of thrombus. Normal compressibility and flow on color Doppler imaging. Superficial Great Saphenous Vein: No evidence of thrombus. Normal compressibility. Venous Reflux:  None. Other Findings:  None. LEFT LOWER EXTREMITY Common Femoral Vein: No evidence of thrombus. Normal compressibility, respiratory phasicity and response to augmentation. Saphenofemoral  Junction: No evidence of thrombus. Normal compressibility and flow on color Doppler imaging. Profunda Femoral Vein: No evidence of thrombus. Normal compressibility and flow on color Doppler imaging. Femoral Vein: No evidence of thrombus. Normal compressibility, respiratory phasicity and response to augmentation. Popliteal Vein: No evidence of thrombus. Normal compressibility, respiratory phasicity and response to augmentation. Calf Veins: No evidence of thrombus. Normal compressibility and flow on color Doppler imaging. Superficial Great Saphenous Vein: No evidence of thrombus. Normal compressibility. Venous Reflux:  None. Other Findings:  None. IMPRESSION: No evidence of deep venous thrombosis in either lower extremity. Electronically Signed   By: Inez Catalina M.D.   On: 09/27/2021 22:24    Procedures Procedures  {Document cardiac monitor, telemetry assessment procedure when appropriate:1}  Medications Ordered in ED Medications  fentaNYL (SUBLIMAZE) injection 50 mcg (50 mcg Intravenous Given 09/27/21 2130)  oxyCODONE-acetaminophen (PERCOCET/ROXICET) 5-325 MG per tablet 1 tablet (1 tablet Oral Given 09/27/21 2241)  sodium chloride 0.9 % bolus 1,000 mL (1,000 mLs Intravenous New Bag/Given 09/27/21 2242)    ED Course/ Medical Decision Making/ A&P                           Medical Decision Making Amount and/or Complexity of Data Reviewed Labs: ordered. Decision-making details documented in ED Course. Radiology: ordered and independent interpretation performed. Decision-making details documented in ED Course. ECG/medicine tests: ordered and independent interpretation performed. Decision-making details documented in ED Course.  Risk Prescription drug management.  Bilateral leg pain after receiving radiation 2 days ago.  No fall or trauma.  Pain is quite severe and patient states he cannot ambulate and cannot walk.  He does have intact strength, sensation, pulses and reflexes.  Garbled speech and  difficulty giving a history.  Unclear what his baseline is.  Does have known brain metastases.  Labs done in triage show leukocytosis which appears to be chronic.  Mild hyponatremia of 127, CK and electrolytes are otherwise normal. Dopplers are negative for DVTs.  Results are interpreted by me  {Document critical care time when appropriate:1} {Document review of labs and clinical decision tools ie heart score, Chads2Vasc2 etc:1}  {Document your independent review of radiology images, and any outside records:1} {Document your discussion with family members, caretakers, and with consultants:1} {Document social determinants  of health affecting pt's care:1} {Document your decision making why or why not admission, treatments were needed:1} Final Clinical Impression(s) / ED Diagnoses Final diagnoses:  None    Rx / DC Orders ED Discharge Orders     None

## 2021-09-27 NOTE — ED Notes (Signed)
Patient transported to CT 

## 2021-09-27 NOTE — Telephone Encounter (Signed)
Chart reviewed, patient is being evaluated through ER.

## 2021-09-27 NOTE — Progress Notes (Signed)
  [facility] will assume care on arrival to accepting facility. Until arrival, care as per EDP. However, [facility] available 24/7 for questions and assistance.   Nursing staff please page [facility] Admits and Consults ([phone]) as soon as the patient arrives to the hospital.  Kealie Barrie, DO [facility]  

## 2021-09-27 NOTE — Telephone Encounter (Signed)
Can you make a recommendation OTC medication?

## 2021-09-27 NOTE — ED Notes (Signed)
Pt is in severe pain.  Medicated to help pt be more comfortable

## 2021-09-27 NOTE — ED Triage Notes (Signed)
Pt arrives by [facility] from home.  Pt called EMS due to bilateral leg pain not associated with any trauma.  Pt is undergoing radiation due to no small cell lung cancer with mets to the brain.  Pain is from knees down, pain is worse in right than left. No swelling noted.  Pt denies any increased sob.

## 2021-09-28 ENCOUNTER — Inpatient Hospital Stay (HOSPITAL_BASED_OUTPATIENT_CLINIC_OR_DEPARTMENT_OTHER): Payer: PPO | Admitting: Nurse Practitioner

## 2021-09-28 ENCOUNTER — Other Ambulatory Visit (HOSPITAL_BASED_OUTPATIENT_CLINIC_OR_DEPARTMENT_OTHER): Payer: Self-pay

## 2021-09-28 ENCOUNTER — Encounter: Payer: Self-pay | Admitting: Nurse Practitioner

## 2021-09-28 VITALS — BP 121/91 | HR 100 | Temp 98.3°F | Resp 18 | Ht 69.0 in | Wt 129.0 lb

## 2021-09-28 DIAGNOSIS — C770 Secondary and unspecified malignant neoplasm of lymph nodes of head, face and neck: Secondary | ICD-10-CM | POA: Diagnosis not present

## 2021-09-28 DIAGNOSIS — R479 Unspecified speech disturbances: Secondary | ICD-10-CM | POA: Diagnosis not present

## 2021-09-28 DIAGNOSIS — Z5111 Encounter for antineoplastic chemotherapy: Secondary | ICD-10-CM | POA: Diagnosis present

## 2021-09-28 DIAGNOSIS — C7931 Secondary malignant neoplasm of brain: Secondary | ICD-10-CM | POA: Insufficient documentation

## 2021-09-28 DIAGNOSIS — C3411 Malignant neoplasm of upper lobe, right bronchus or lung: Secondary | ICD-10-CM | POA: Insufficient documentation

## 2021-09-28 DIAGNOSIS — C3490 Malignant neoplasm of unspecified part of unspecified bronchus or lung: Secondary | ICD-10-CM | POA: Diagnosis not present

## 2021-09-28 DIAGNOSIS — M069 Rheumatoid arthritis, unspecified: Secondary | ICD-10-CM | POA: Diagnosis not present

## 2021-09-28 DIAGNOSIS — Z5112 Encounter for antineoplastic immunotherapy: Secondary | ICD-10-CM | POA: Diagnosis present

## 2021-09-28 DIAGNOSIS — R4701 Aphasia: Secondary | ICD-10-CM | POA: Diagnosis not present

## 2021-09-28 DIAGNOSIS — Z87891 Personal history of nicotine dependence: Secondary | ICD-10-CM | POA: Insufficient documentation

## 2021-09-28 DIAGNOSIS — R471 Dysarthria and anarthria: Secondary | ICD-10-CM | POA: Diagnosis not present

## 2021-09-28 MED ORDER — DEXAMETHASONE 2 MG PO TABS
ORAL_TABLET | ORAL | 1 refills | Status: DC
  Filled 2021-09-28: qty 30, 44d supply, fill #0

## 2021-09-28 NOTE — ED Provider Notes (Signed)
Patient has decided he does not want to be admitted and will go home. He understands the reasons for admission and that without further workup, we cannot exclude an emergent cause of his weakness. Advised to follow up with his doctors as an outpatient.    Pollyann Savoy, MD 09/28/21 [phone]

## 2021-09-28 NOTE — ED Notes (Signed)
MD sheldon and RN bedside to speak to pt about discharge vs admit. Pt wants to go home.

## 2021-09-30 ENCOUNTER — Other Ambulatory Visit: Payer: Self-pay | Admitting: Oncology

## 2021-10-01 ENCOUNTER — Encounter: Payer: Self-pay | Admitting: Oncology

## 2021-10-03 ENCOUNTER — Ambulatory Visit: Payer: PPO | Admitting: Nurse Practitioner

## 2021-10-03 ENCOUNTER — Inpatient Hospital Stay: Payer: PPO

## 2021-10-03 ENCOUNTER — Other Ambulatory Visit: Payer: Self-pay | Admitting: *Deleted

## 2021-10-03 VITALS — BP 141/92 | HR 73 | Temp 97.5°F | Resp 18

## 2021-10-03 DIAGNOSIS — Z5112 Encounter for antineoplastic immunotherapy: Secondary | ICD-10-CM | POA: Diagnosis not present

## 2021-10-03 DIAGNOSIS — C3490 Malignant neoplasm of unspecified part of unspecified bronchus or lung: Secondary | ICD-10-CM

## 2021-10-03 LAB — CBC WITH DIFFERENTIAL (CANCER CENTER ONLY)
Abs Immature Granulocytes: 0.22 10*3/uL — ABNORMAL HIGH (ref 0.00–0.07)
Basophils Absolute: 0 10*3/uL (ref 0.0–0.1)
Basophils Relative: 0 %
Eosinophils Absolute: 0.2 10*3/uL (ref 0.0–0.5)
Eosinophils Relative: 1 %
HCT: 32.3 % — ABNORMAL LOW (ref 39.0–52.0)
Hemoglobin: 10.7 g/dL — ABNORMAL LOW (ref 13.0–17.0)
Immature Granulocytes: 1 %
Lymphocytes Relative: 3 %
Lymphs Abs: 0.6 10*3/uL — ABNORMAL LOW (ref 0.7–4.0)
MCH: 27.4 pg (ref 26.0–34.0)
MCHC: 33.1 g/dL (ref 30.0–36.0)
MCV: 82.8 fL (ref 80.0–100.0)
Monocytes Absolute: 1.3 10*3/uL — ABNORMAL HIGH (ref 0.1–1.0)
Monocytes Relative: 7 %
Neutro Abs: 16.4 10*3/uL — ABNORMAL HIGH (ref 1.7–7.7)
Neutrophils Relative %: 88 %
Platelet Count: 391 10*3/uL (ref 150–400)
RBC: 3.9 MIL/uL — ABNORMAL LOW (ref 4.22–5.81)
RDW: 17.6 % — ABNORMAL HIGH (ref 11.5–15.5)
WBC Count: 18.7 10*3/uL — ABNORMAL HIGH (ref 4.0–10.5)
nRBC: 0 % (ref 0.0–0.2)

## 2021-10-03 LAB — CMP (CANCER CENTER ONLY)
ALT: 25 U/L (ref 0–44)
AST: 16 U/L (ref 15–41)
Albumin: 3.4 g/dL — ABNORMAL LOW (ref 3.5–5.0)
Alkaline Phosphatase: 71 U/L (ref 38–126)
Anion gap: 8 (ref 5–15)
BUN: 19 mg/dL (ref 6–20)
CO2: 26 mmol/L (ref 22–32)
Calcium: 9.3 mg/dL (ref 8.9–10.3)
Chloride: 94 mmol/L — ABNORMAL LOW (ref 98–111)
Creatinine: 0.66 mg/dL (ref 0.61–1.24)
GFR, Estimated: >60 mL/min (ref >60)
Glucose, Bld: 91 mg/dL (ref 70–99)
Potassium: 4.9 mmol/L (ref 3.5–5.1)
Sodium: 128 mmol/L — ABNORMAL LOW (ref 135–145)
Total Bilirubin: 0.3 mg/dL (ref 0.3–1.2)
Total Protein: 6.8 g/dL (ref 6.5–8.1)

## 2021-10-03 LAB — TSH: TSH: 9.007 u[IU]/mL — ABNORMAL HIGH (ref 0.350–4.500)

## 2021-10-03 MED ORDER — CYANOCOBALAMIN 1000 MCG/ML IJ SOLN
1000.0000 ug | Freq: Once | INTRAMUSCULAR | Status: AC
  Administered 2021-10-03: 1000 ug via INTRAMUSCULAR

## 2021-10-03 MED ORDER — FOLIC ACID 1 MG PO TABS: 1.0000 mg | ORAL_TABLET | Freq: Every day | ORAL | 5 refills | Status: AC

## 2021-10-03 MED ORDER — LOPERAMIDE HCL 2 MG PO CAPS: 2.0000 mg | ORAL_CAPSULE | ORAL | 0 refills | Status: AC | PRN

## 2021-10-03 MED ORDER — ONDANSETRON HCL 8 MG PO TABS: 8.0000 mg | ORAL_TABLET | Freq: Three times a day (TID) | ORAL | 1 refills | Status: AC | PRN

## 2021-10-03 MED ORDER — CYANOCOBALAMIN 1000 MCG/ML IJ SOLN: 1000.0000 ug | Freq: Once | INTRAMUSCULAR | Status: AC

## 2021-10-05 ENCOUNTER — Inpatient Hospital Stay: Payer: PPO

## 2021-10-05 ENCOUNTER — Inpatient Hospital Stay: Payer: PPO | Admitting: Oncology

## 2021-10-05 VITALS — BP 140/87 | HR 78

## 2021-10-05 VITALS — BP 122/88 | HR 100 | Temp 98.1°F | Resp 18 | Ht 69.0 in | Wt 119.2 lb

## 2021-10-05 DIAGNOSIS — C3490 Malignant neoplasm of unspecified part of unspecified bronchus or lung: Secondary | ICD-10-CM

## 2021-10-05 DIAGNOSIS — C7931 Secondary malignant neoplasm of brain: Secondary | ICD-10-CM

## 2021-10-05 DIAGNOSIS — Z5112 Encounter for antineoplastic immunotherapy: Secondary | ICD-10-CM | POA: Diagnosis not present

## 2021-10-05 MED ORDER — PALONOSETRON HCL INJECTION 0.25 MG/5ML
0.2500 mg | Freq: Once | INTRAVENOUS | Status: AC
  Administered 2021-10-05: 0.25 mg via INTRAVENOUS
  Filled 2021-10-05: qty 5

## 2021-10-05 MED ORDER — SODIUM CHLORIDE 0.9 % IV SOLN
150.0000 mg | Freq: Once | INTRAVENOUS | Status: AC
  Administered 2021-10-05: 150 mg via INTRAVENOUS
  Filled 2021-10-05: qty 150

## 2021-10-05 MED ORDER — SODIUM CHLORIDE 0.9 % IV SOLN
200.0000 mg | Freq: Once | INTRAVENOUS | Status: AC
  Administered 2021-10-05: 200 mg via INTRAVENOUS
  Filled 2021-10-05: qty 8

## 2021-10-05 MED ORDER — SODIUM CHLORIDE 0.9 % IV SOLN
10.0000 mg | Freq: Once | INTRAVENOUS | Status: AC
  Administered 2021-10-05: 10 mg via INTRAVENOUS
  Filled 2021-10-05: qty 10

## 2021-10-05 MED ORDER — SODIUM CHLORIDE 0.9 % IV SOLN: Freq: Once | INTRAVENOUS | Status: AC

## 2021-10-05 MED ORDER — SODIUM CHLORIDE 0.9 % IV SOLN
500.0000 mg/m2 | Freq: Once | INTRAVENOUS | Status: AC
  Administered 2021-10-05: 800 mg via INTRAVENOUS
  Filled 2021-10-05: qty 12

## 2021-10-05 MED ORDER — SODIUM CHLORIDE 0.9 % IV SOLN
404.4000 mg | Freq: Once | INTRAVENOUS | Status: AC
  Administered 2021-10-05: 400 mg via INTRAVENOUS
  Filled 2021-10-05: qty 40

## 2021-10-05 NOTE — Progress Notes (Signed)
  [facility] OFFICE PROGRESS NOTE   Diagnosis: Non-small cell lung cancer  INTERVAL HISTORY:   Douglas Holt returns as scheduled.  He is here with his brother.  He complains of bilateral knee discomfort.  He continues to have dysarthria and expressive aphasia.  He is on a Decadron taper.  Objective:  Vital signs in last 24 hours:  Blood pressure 122/88, pulse 100, temperature 98.1 F (36.7 C), resp. rate 18, height _0  (1.753 m), weight 119 lb 3.2 oz (54.1 kg), SpO2 98 %.   Resp: Lungs clear bilaterally with decreased breath sounds at the right lower posterior chest, no respiratory distress Cardio: Regular rate and rhythm GI: No hepatosplenomegaly Vascular: No leg edema  Skin: Confluent erythema over the abdomen and lower back   Lab Results:  Lab Results  Component Value Date   WBC 18.7 (H) 10/03/2021   HGB 10.7 (L) 10/03/2021   HCT 32.3 (L) 10/03/2021   MCV 82.8 10/03/2021   PLT 391 10/03/2021   NEUTROABS 16.4 (H) 10/03/2021    CMP  Lab Results  Component Value Date   NA 128 (L) 10/03/2021   K 4.9 10/03/2021   CL 94 (L) 10/03/2021   CO2 26 10/03/2021   GLUCOSE 91 10/03/2021   BUN 19 10/03/2021   CREATININE 0.66 10/03/2021   CALCIUM 9.3 10/03/2021   PROT 6.8 10/03/2021   ALBUMIN 3.4 (L) 10/03/2021   AST 16 10/03/2021   ALT 25 10/03/2021   ALKPHOS 71 10/03/2021   BILITOT 0.3 10/03/2021   GFRNONAA >60 10/03/2021   GFRAA >90 09/30/2011     Medications: I have reviewed the patient's current medications.   Assessment/Plan: Metastatic non-small cell lung cancer with neck adenopathy and brain metastases.   -CT soft tissue neck 09/05/1998 23-1.6 x 1.1 cm hyperenhancing and centrally cystic/necrotic right level 2/3 lymph node consistent with nodal metastatic disease, prominent metastatic lymphadenopathy in the right lower neck, right retroclavicular region, right axilla, and mediastinum, lesions in the right paraspinal musculature at C2-C3 and T2-T3,  2.4 x 2.0 cm enhancing intracranial metastasis with the left temporal lobe with prominent surrounding edema, 2.6 x 2.5 cm peripherally enhancing metastasis within the medial right cerebellar hemisphere with surrounding edema. -CT chest 09/04/2021-highly aggressive appearing partially cavitary right upper lobe mass. -CT head with and without contrast 09/07/2021-6-7 metastatic lesions identified with associated edema and regional mass effect -Biopsy of a right supraclavicular lymph node performed 09/07/2021-metastatic poorly differentiated lung adenocarcinoma, positive for TTF-1, negative for p63 and CK5/6; microsatellite stable, tumor mutation burden 22; PD-L1 TPS 45% -Brain radiation 09/13/2021 - 09/26/2021 -Cycle 1 Alimta/carboplatin/pembrolizumab 10/05/2021 Rheumatoid arthritis Expressive aphasia, right-sided weakness, and altered mental status-likely secondary to brain metastases, improved on Decadron History of tobacco and alcohol use Hyponatremia on a chemistry panel 08/15/2021    Disposition: Douglas Holt appears stable.  He will continue the Decadron taper as prescribed.  He will complete cycle 1 of systemic therapy with Alimta/carboplatin/pembrolizumab today.  He will return for an office visit and CBC 10/16/2021.  The chemotherapy was dose adjusted to his current weight.  We reviewed potential toxicities associated with chemotherapy.  He attended a chemotherapy teaching class.  Understands potential for a flare of rheumatoid arthritis while being treated with pembrolizumab.  Betsy Coder, MD  10/05/2021  11:55 AM

## 2021-10-05 NOTE — Patient Instructions (Signed)
[facility]las [facility]   Discharge Instructions: Thank you for choosing [facility] to provide your oncology and hematology care.   If you have a lab appointment with the [facility], please go directly to the [facility] and check in at the registration area.   Wear comfortable clothing and clothing appropriate for easy access to any Portacath or PICC line.   We strive to give you quality time with your provider. You may need to reschedule your appointment if you arrive late (15 or more minutes).  Arriving late affects you and other patients whose appointments are after yours.  Also, if you miss three or more appointments without notifying the office, you may be dismissed from the clinic at the provider's discretion.      For prescription refill requests, have your pharmacy contact our office and allow 72 hours for refills to be completed.    Today you received the following chemotherapy and/or immunotherapy agents Keytruda, Alimta, Carboplatin      To help prevent nausea and vomiting after your treatment, we encourage you to take your nausea medication as directed.  BELOW ARE SYMPTOMS THAT SHOULD BE REPORTED IMMEDIATELY: *FEVER GREATER THAN 100.4 F (38 C) OR HIGHER *CHILLS OR SWEATING *NAUSEA AND VOMITING THAT IS NOT CONTROLLED WITH YOUR NAUSEA MEDICATION *UNUSUAL SHORTNESS OF BREATH *UNUSUAL BRUISING OR BLEEDING *URINARY PROBLEMS (pain or burning when urinating, or frequent urination) *BOWEL PROBLEMS (unusual diarrhea, constipation, pain near the anus) TENDERNESS IN MOUTH AND THROAT WITH OR WITHOUT PRESENCE OF ULCERS (sore throat, sores in mouth, or a toothache) UNUSUAL RASH, SWELLING OR PAIN  UNUSUAL VAGINAL DISCHARGE OR ITCHING   Items with * indicate a potential emergency and should be followed up as soon as possible or go to the Emergency Department if any problems should occur.  Please show the CHEMOTHERAPY ALERT CARD or IMMUNOTHERAPY ALERT  CARD at check-in to the Emergency Department and triage nurse.  Should you have questions after your visit or need to cancel or reschedule your appointment, please contact [facility]  Dept: [phone]  and follow the prompts.  Office hours are 8:00 a.m. to 4:30 p.m. Monday - Friday. Please note that voicemails left after 4:00 p.m. may not be returned until the following business day.  We are closed weekends and major holidays. You have access to a nurse at all times for urgent questions. Please call the main number to the clinic Dept: [phone] and follow the prompts.   For any non-urgent questions, you may also contact your provider using MyChart. We now offer e-Visits for anyone 35 and older to request care online for non-urgent symptoms. For details visit [email or web address].   Also download the MyChart app! Go to the app store, search "MyChart", open the app, select [facility], and log in with your MyChart username and password.  Masks are optional in the cancer centers. If you would like for your care team to wear a mask while they are taking care of you, please let them know. For doctor visits, patients may have with them one support person who is at least 60 years old. At this time, visitors are not allowed in the infusion area.  Pembrolizumab injection What is this medication? PEMBROLIZUMAB (pem broe liz ue mab) is a monoclonal antibody. It is used to treat certain types of cancer. This medicine may be used for other purposes; ask your health care provider or pharmacist if you have questions. COMMON BRAND NAME(S):  Keytruda What should I tell my care team before I take this medication? They need to know if you have any of these conditions: autoimmune diseases like Crohn's disease, ulcerative colitis, or lupus have had or planning to have an allogeneic stem cell transplant (uses someone else's stem cells) history of organ transplant history of chest  radiation nervous system problems like myasthenia gravis or Guillain-Barre syndrome an unusual or allergic reaction to pembrolizumab, other medicines, foods, dyes, or preservatives pregnant or trying to get pregnant breast-feeding How should I use this medication? This medicine is for infusion into a vein. It is given by a health care professional in a hospital or clinic setting. A special MedGuide will be given to you before each treatment. Be sure to read this information carefully each time. Talk to your pediatrician regarding the use of this medicine in children. While this drug may be prescribed for children as young as 6 months for selected conditions, precautions do apply. Overdosage: If you think you have taken too much of this medicine contact a poison control center or emergency room at once. NOTE: This medicine is only for you. Do not share this medicine with others. What if I miss a dose? It is important not to miss your dose. Call your doctor or health care professional if you are unable to keep an appointment. What may interact with this medication? Interactions have not been studied. This list may not describe all possible interactions. Give your health care provider a list of all the medicines, herbs, non-prescription drugs, or dietary supplements you use. Also tell them if you smoke, drink alcohol, or use illegal drugs. Some items may interact with your medicine. What should I watch for while using this medication? Your condition will be monitored carefully while you are receiving this medicine. You may need blood work done while you are taking this medicine. Do not become pregnant while taking this medicine or for 4 months after stopping it. Women should inform their doctor if they wish to become pregnant or think they might be pregnant. There is a potential for serious side effects to an unborn child. Talk to your health care professional or pharmacist for more information. Do  not breast-feed an infant while taking this medicine or for 4 months after the last dose. What side effects may I notice from receiving this medication? Side effects that you should report to your doctor or health care professional as soon as possible: allergic reactions like skin rash, itching or hives, swelling of the face, lips, or tongue bloody or black, tarry breathing problems changes in vision chest pain chills confusion constipation cough diarrhea dizziness or feeling faint or lightheaded fast or irregular heartbeat fever flushing joint pain low blood counts - this medicine may decrease the number of white blood cells, red blood cells and platelets. You may be at increased risk for infections and bleeding. muscle pain muscle weakness pain, tingling, numbness in the hands or feet persistent headache redness, blistering, peeling or loosening of the skin, including inside the mouth signs and symptoms of high blood sugar such as dizziness; dry mouth; dry skin; fruity breath; nausea; stomach pain; increased hunger or thirst; increased urination signs and symptoms of kidney injury like trouble passing urine or change in the amount of urine signs and symptoms of liver injury like dark urine, light-colored stools, loss of appetite, nausea, right upper belly pain, yellowing of the eyes or skin sweating swollen lymph nodes weight loss Side effects that usually do not  require medical attention (report to your doctor or health care professional if they continue or are bothersome): decreased appetite hair loss tiredness This list may not describe all possible side effects. Call your doctor for medical advice about side effects. You may report side effects to FDA at 1-800-FDA-1088. Where should I keep my medication? This drug is given in a hospital or clinic and will not be stored at home. NOTE: This sheet is a summary. It may not cover all possible information. If you have questions  about this medicine, talk to your doctor, pharmacist, or health care provider.  2023 Elsevier/Gold Standard (2021-02-23 00:00:00) Pemetrexed injection What is this medication? PEMETREXED (PEM e TREX ed) is a chemotherapy drug used to treat lung cancers like non-small cell lung cancer and mesothelioma. It may also be used to treat other cancers. This medicine may be used for other purposes; ask your health care provider or pharmacist if you have questions. COMMON BRAND NAME(S): Alimta, PEMFEXY What should I tell my care team before I take this medication? They need to know if you have any of these conditions: infection (especially a virus infection such as chickenpox, cold sores, or herpes) kidney disease low blood counts, like low white cell, platelet, or red cell counts lung or breathing disease, like asthma radiation therapy an unusual or allergic reaction to pemetrexed, other medicines, foods, dyes, or preservative pregnant or trying to get pregnant breast-feeding How should I use this medication? This drug is given as an infusion into a vein. It is administered in a hospital or clinic by a specially trained health care professional. Talk to your pediatrician regarding the use of this medicine in children. Special care may be needed. Overdosage: If you think you have taken too much of this medicine contact a poison control center or emergency room at once. NOTE: This medicine is only for you. Do not share this medicine with others. What if I miss a dose? It is important not to miss your dose. Call your doctor or health care professional if you are unable to keep an appointment. What may interact with this medication? This medicine may interact with the following medications: Ibuprofen This list may not describe all possible interactions. Give your health care provider a list of all the medicines, herbs, non-prescription drugs, or dietary supplements you use. Also tell them if you  smoke, drink alcohol, or use illegal drugs. Some items may interact with your medicine. What should I watch for while using this medication? Visit your doctor for checks on your progress. This drug may make you feel generally unwell. This is not uncommon, as chemotherapy can affect healthy cells as well as cancer cells. Report any side effects. Continue your course of treatment even though you feel ill unless your doctor tells you to stop. In some cases, you may be given additional medicines to help with side effects. Follow all directions for their use. Call your doctor or health care professional for advice if you get a fever, chills or sore throat, or other symptoms of a cold or flu. Do not treat yourself. This drug decreases your body's ability to fight infections. Try to avoid being around people who are sick. This medicine may increase your risk to bruise or bleed. Call your doctor or health care professional if you notice any unusual bleeding. Be careful brushing and flossing your teeth or using a toothpick because you may get an infection or bleed more easily. If you have any dental work done, tell  your dentist you are receiving this medicine. Avoid taking products that contain aspirin, acetaminophen, ibuprofen, naproxen, or ketoprofen unless instructed by your doctor. These medicines may hide a fever. Call your doctor or health care professional if you get diarrhea or mouth sores. Do not treat yourself. To protect your kidneys, drink water or other fluids as directed while you are taking this medicine. Do not become pregnant while taking this medicine or for 6 months after stopping it. Women should inform their doctor if they wish to become pregnant or think they might be pregnant. Men should not father a child while taking this medicine and for 3 months after stopping it. This may interfere with the ability to father a child. You should talk to your doctor or health care professional if you are  concerned about your fertility. There is a potential for serious side effects to an unborn child. Talk to your health care professional or pharmacist for more information. Do not breast-feed an infant while taking this medicine or for 1 week after stopping it. What side effects may I notice from receiving this medication? Side effects that you should report to your doctor or health care professional as soon as possible: allergic reactions like skin rash, itching or hives, swelling of the face, lips, or tongue breathing problems redness, blistering, peeling or loosening of the skin, including inside the mouth signs and symptoms of bleeding such as bloody or black, tarry stools; red or dark-brown urine; spitting up blood or brown material that looks like coffee grounds; red spots on the skin; unusual bruising or bleeding from the eye, gums, or nose signs and symptoms of infection like fever or chills; cough; sore throat; pain or trouble passing urine signs and symptoms of kidney injury like trouble passing urine or change in the amount of urine signs and symptoms of liver injury like dark yellow or brown urine; general ill feeling or flu-like symptoms; light-colored stools; loss of appetite; nausea; right upper belly pain; unusually weak or tired; yellowing of the eyes or skin Side effects that usually do not require medical attention (report to your doctor or health care professional if they continue or are bothersome): constipation mouth sores nausea, vomiting unusually weak or tired This list may not describe all possible side effects. Call your doctor for medical advice about side effects. You may report side effects to FDA at 1-800-FDA-1088. Where should I keep my medication? This drug is given in a hospital or clinic and will not be stored at home. NOTE: This sheet is a summary. It may not cover all possible information. If you have questions about this medicine, talk to your doctor,  pharmacist, or health care provider.  2023 Elsevier/Gold Standard (2017-05-20 00:00:00) Carboplatin injection What is this medication? CARBOPLATIN (KAR boe pla tin) is a chemotherapy drug. It targets fast dividing cells, like cancer cells, and causes these cells to die. This medicine is used to treat ovarian cancer and many other cancers. This medicine may be used for other purposes; ask your health care provider or pharmacist if you have questions. COMMON BRAND NAME(S): Paraplatin What should I tell my care team before I take this medication? They need to know if you have any of these conditions: blood disorders hearing problems kidney disease recent or ongoing radiation therapy an unusual or allergic reaction to carboplatin, cisplatin, other chemotherapy, other medicines, foods, dyes, or preservatives pregnant or trying to get pregnant breast-feeding How should I use this medication? This drug is usually given as  an infusion into a vein. It is administered in a hospital or clinic by a specially trained health care professional. Talk to your pediatrician regarding the use of this medicine in children. Special care may be needed. Overdosage: If you think you have taken too much of this medicine contact a poison control center or emergency room at once. NOTE: This medicine is only for you. Do not share this medicine with others. What if I miss a dose? It is important not to miss a dose. Call your doctor or health care professional if you are unable to keep an appointment. What may interact with this medication? medicines for seizures medicines to increase blood counts like filgrastim, pegfilgrastim, sargramostim some antibiotics like amikacin, gentamicin, neomycin, streptomycin, tobramycin vaccines Talk to your doctor or health care professional before taking any of these medicines: acetaminophen aspirin ibuprofen ketoprofen naproxen This list may not describe all possible  interactions. Give your health care provider a list of all the medicines, herbs, non-prescription drugs, or dietary supplements you use. Also tell them if you smoke, drink alcohol, or use illegal drugs. Some items may interact with your medicine. What should I watch for while using this medication? Your condition will be monitored carefully while you are receiving this medicine. You will need important blood work done while you are taking this medicine. This drug may make you feel generally unwell. This is not uncommon, as chemotherapy can affect healthy cells as well as cancer cells. Report any side effects. Continue your course of treatment even though you feel ill unless your doctor tells you to stop. In some cases, you may be given additional medicines to help with side effects. Follow all directions for their use. Call your doctor or health care professional for advice if you get a fever, chills or sore throat, or other symptoms of a cold or flu. Do not treat yourself. This drug decreases your body's ability to fight infections. Try to avoid being around people who are sick. This medicine may increase your risk to bruise or bleed. Call your doctor or health care professional if you notice any unusual bleeding. Be careful brushing and flossing your teeth or using a toothpick because you may get an infection or bleed more easily. If you have any dental work done, tell your dentist you are receiving this medicine. Avoid taking products that contain aspirin, acetaminophen, ibuprofen, naproxen, or ketoprofen unless instructed by your doctor. These medicines may hide a fever. Do not become pregnant while taking this medicine. Women should inform their doctor if they wish to become pregnant or think they might be pregnant. There is a potential for serious side effects to an unborn child. Talk to your health care professional or pharmacist for more information. Do not breast-feed an infant while taking this  medicine. What side effects may I notice from receiving this medication? Side effects that you should report to your doctor or health care professional as soon as possible: allergic reactions like skin rash, itching or hives, swelling of the face, lips, or tongue signs of infection - fever or chills, cough, sore throat, pain or difficulty passing urine signs of decreased platelets or bleeding - bruising, pinpoint red spots on the skin, black, tarry stools, nosebleeds signs of decreased red blood cells - unusually weak or tired, fainting spells, lightheadedness breathing problems changes in hearing changes in vision chest pain high blood pressure low blood counts - This drug may decrease the number of white blood cells, red blood cells and  platelets. You may be at increased risk for infections and bleeding. nausea and vomiting pain, swelling, redness or irritation at the injection site pain, tingling, numbness in the hands or feet problems with balance, talking, walking trouble passing urine or change in the amount of urine Side effects that usually do not require medical attention (report to your doctor or health care professional if they continue or are bothersome): hair loss loss of appetite metallic taste in the mouth or changes in taste This list may not describe all possible side effects. Call your doctor for medical advice about side effects. You may report side effects to FDA at 1-800-FDA-1088. Where should I keep my medication? This drug is given in a hospital or clinic and will not be stored at home. NOTE: This sheet is a summary. It may not cover all possible information. If you have questions about this medicine, talk to your doctor, pharmacist, or health care provider.  2023 Elsevier/Gold Standard (2007-09-02 00:00:00)

## 2021-10-05 NOTE — Progress Notes (Signed)
Patient seen by Dr. Benay Spice today  Vitals are within treatment parameters.  Labs reviewed by Dr. Benay Spice and are within treatment parameters.  Per physician team, patient is ready for treatment. Please note that modifications are being made to the treatment plan including dosage changes.

## 2021-10-08 ENCOUNTER — Telehealth: Payer: Self-pay | Admitting: Emergency Medicine

## 2021-10-08 ENCOUNTER — Telehealth: Payer: Self-pay | Admitting: *Deleted

## 2021-10-08 NOTE — Telephone Encounter (Signed)
24 Hour Callback 24 hour callback for 1st time Keytruda, Alimta, Carboplatin infusion.  Spoke with patients brother Milon.  Patient has done well no N/V/D. Pt complains of fatigue.  Was unable to pick up folic acid and zofran due to pre-approval needed.  Pt had folic acid at home and has compazine until he can get zofran and folic acid. Patient and brother know to call with any questions or concerns.

## 2021-10-08 NOTE — Telephone Encounter (Signed)
Received notification from [employer] that ondansetron 8 mg oral tablet has been approved. Notified pharmacy. Folic acid has been denied despite form completed with reason for script. They do not cover vitamins as prescription since this can be obtained OTC as well. Notified his brother, [name] of above. Sherron is doing well, just some fatigue

## 2021-10-15 ENCOUNTER — Other Ambulatory Visit: Payer: Self-pay

## 2021-10-15 ENCOUNTER — Other Ambulatory Visit (HOSPITAL_BASED_OUTPATIENT_CLINIC_OR_DEPARTMENT_OTHER): Payer: Self-pay

## 2021-10-15 ENCOUNTER — Inpatient Hospital Stay: Payer: PPO | Admitting: Nurse Practitioner

## 2021-10-15 ENCOUNTER — Encounter: Payer: Self-pay | Admitting: Nurse Practitioner

## 2021-10-15 ENCOUNTER — Inpatient Hospital Stay: Payer: PPO | Attending: Nurse Practitioner

## 2021-10-15 ENCOUNTER — Telehealth: Payer: Self-pay

## 2021-10-15 ENCOUNTER — Inpatient Hospital Stay: Payer: PPO

## 2021-10-15 ENCOUNTER — Encounter: Payer: Self-pay | Admitting: Oncology

## 2021-10-15 VITALS — BP 112/82 | HR 91 | Temp 98.1°F | Resp 18 | Ht 69.0 in | Wt 113.0 lb

## 2021-10-15 DIAGNOSIS — Z87891 Personal history of nicotine dependence: Secondary | ICD-10-CM | POA: Diagnosis not present

## 2021-10-15 DIAGNOSIS — C3411 Malignant neoplasm of upper lobe, right bronchus or lung: Secondary | ICD-10-CM | POA: Diagnosis present

## 2021-10-15 DIAGNOSIS — M069 Rheumatoid arthritis, unspecified: Secondary | ICD-10-CM | POA: Diagnosis not present

## 2021-10-15 DIAGNOSIS — C3490 Malignant neoplasm of unspecified part of unspecified bronchus or lung: Secondary | ICD-10-CM | POA: Diagnosis not present

## 2021-10-15 DIAGNOSIS — K5900 Constipation, unspecified: Secondary | ICD-10-CM | POA: Insufficient documentation

## 2021-10-15 DIAGNOSIS — C7931 Secondary malignant neoplasm of brain: Secondary | ICD-10-CM | POA: Diagnosis not present

## 2021-10-15 DIAGNOSIS — C770 Secondary and unspecified malignant neoplasm of lymph nodes of head, face and neck: Secondary | ICD-10-CM | POA: Diagnosis not present

## 2021-10-15 DIAGNOSIS — Z5111 Encounter for antineoplastic chemotherapy: Secondary | ICD-10-CM | POA: Diagnosis present

## 2021-10-15 DIAGNOSIS — R627 Adult failure to thrive: Secondary | ICD-10-CM | POA: Diagnosis not present

## 2021-10-15 DIAGNOSIS — Z5112 Encounter for antineoplastic immunotherapy: Secondary | ICD-10-CM | POA: Insufficient documentation

## 2021-10-15 LAB — CMP (CANCER CENTER ONLY)
ALT: 31 U/L (ref 0–44)
AST: 20 U/L (ref 15–41)
Albumin: 3.6 g/dL (ref 3.5–5.0)
Alkaline Phosphatase: 177 U/L — ABNORMAL HIGH (ref 38–126)
Anion gap: 12 (ref 5–15)
BUN: 17 mg/dL (ref 6–20)
CO2: 25 mmol/L (ref 22–32)
Calcium: 9.5 mg/dL (ref 8.9–10.3)
Chloride: 91 mmol/L — ABNORMAL LOW (ref 98–111)
Creatinine: 0.56 mg/dL — ABNORMAL LOW (ref 0.61–1.24)
GFR, Estimated: >60 mL/min (ref >60)
Glucose, Bld: 95 mg/dL (ref 70–99)
Potassium: 4.3 mmol/L (ref 3.5–5.1)
Sodium: 128 mmol/L — ABNORMAL LOW (ref 135–145)
Total Bilirubin: 0.4 mg/dL (ref 0.3–1.2)
Total Protein: 7.4 g/dL (ref 6.5–8.1)

## 2021-10-15 LAB — CBC WITH DIFFERENTIAL (CANCER CENTER ONLY)
Abs Immature Granulocytes: 0.12 10*3/uL — ABNORMAL HIGH (ref 0.00–0.07)
Basophils Absolute: 0 10*3/uL (ref 0.0–0.1)
Basophils Relative: 0 %
Eosinophils Absolute: 0 10*3/uL (ref 0.0–0.5)
Eosinophils Relative: 0 %
HCT: 31.6 % — ABNORMAL LOW (ref 39.0–52.0)
Hemoglobin: 10.8 g/dL — ABNORMAL LOW (ref 13.0–17.0)
Immature Granulocytes: 1 %
Lymphocytes Relative: 6 %
Lymphs Abs: 0.8 10*3/uL (ref 0.7–4.0)
MCH: 28.2 pg (ref 26.0–34.0)
MCHC: 34.2 g/dL (ref 30.0–36.0)
MCV: 82.5 fL (ref 80.0–100.0)
Monocytes Absolute: 0.9 10*3/uL (ref 0.1–1.0)
Monocytes Relative: 6 %
Neutro Abs: 12.1 10*3/uL — ABNORMAL HIGH (ref 1.7–7.7)
Neutrophils Relative %: 87 %
Platelet Count: 345 10*3/uL (ref 150–400)
RBC: 3.83 MIL/uL — ABNORMAL LOW (ref 4.22–5.81)
RDW: 16.4 % — ABNORMAL HIGH (ref 11.5–15.5)
WBC Count: 13.9 10*3/uL — ABNORMAL HIGH (ref 4.0–10.5)
nRBC: 0 % (ref 0.0–0.2)

## 2021-10-15 MED ORDER — OXYCODONE HCL 5 MG PO TABS
2.5000 mg | ORAL_TABLET | Freq: Four times a day (QID) | ORAL | 0 refills | Status: AC | PRN
  Filled 2021-10-15: qty 30, 15d supply, fill #0

## 2021-10-15 MED ORDER — SODIUM CHLORIDE 0.9 % IV SOLN: INTRAVENOUS | Status: AC

## 2021-10-15 MED ORDER — OXYCODONE HCL 5 MG PO TABS
2.5000 mg | ORAL_TABLET | Freq: Once | ORAL | Status: AC
  Administered 2021-10-15: 2.5 mg via ORAL
  Filled 2021-10-15: qty 1

## 2021-10-15 NOTE — Progress Notes (Signed)
Patient was given Oxycodone 2.5 mg for bilateral leg pain. He tolerated medication well. He verbalized improved pain. Patient also reported feeling drowsy after medication administration. Patient and family member were educated on the side effects of narcotic administration especially increased chances of falls. They were encouraged to ensure patient is supervised when changing position or ambulating to prevent falls. Patient and his brother verbalized understanding.

## 2021-10-15 NOTE — Progress Notes (Signed)
[place] OFFICE PROGRESS NOTE   Diagnosis: Non-small cell lung cancer  INTERVAL HISTORY:   Mr. Redner returns prior to scheduled follow-up.  He completed cycle 1 carboplatin/Alimta/Pembrolizumab 10/05/2021.  He denies nausea/vomiting.  No mouth sores.  No diarrhea.  Skin rash is better.  He is constipated, has abdominal discomfort.  "Arthritis pain" is not controlled with Tylenol.  He has pain involving the hips, knees and ankles.  He feels he is dehydrated.  Mouth is dry.  Energy level is poor.  Objective:  Vital signs in last 24 hours:  Blood pressure 112/82, pulse 91, temperature 98.1 F (36.7 C), temperature source Oral, resp. rate 18, height _0  (1.753 m), weight 113 lb (51.3 kg).    HEENT: No thrush or ulcers.  Mucous membranes appear moist. Lymphatics: Right neck nodes are less apparent. Resp: Lungs clear bilaterally with decreased breath sounds at the right lower base.  No respiratory distress. Cardio: Regular rate and rhythm. GI: No hepatosplenomegaly. Vascular: No leg edema. Neuro: Partial expressive aphasia.  Follows commands. Skin: Confluent rash over the abdomen and lower back appears to be resolving.   Lab Results:  Lab Results  Component Value Date   WBC 13.9 (H) 10/15/2021   HGB 10.8 (L) 10/15/2021   HCT 31.6 (L) 10/15/2021   MCV 82.5 10/15/2021   PLT 345 10/15/2021   NEUTROABS 12.1 (H) 10/15/2021    Imaging:  No results found.  Medications: I have reviewed the patient's current medications.  Assessment/Plan: Metastatic non-small cell lung cancer with neck adenopathy and brain metastases.   -CT soft tissue neck 09/05/1998 23-1.6 x 1.1 cm hyperenhancing and centrally cystic/necrotic right level 2/3 lymph node consistent with nodal metastatic disease, prominent metastatic lymphadenopathy in the right lower neck, right retroclavicular region, right axilla, and mediastinum, lesions in the right paraspinal musculature at C2-C3 and T2-T3,  2.4 x 2.0 cm enhancing intracranial metastasis with the left temporal lobe with prominent surrounding edema, 2.6 x 2.5 cm peripherally enhancing metastasis within the medial right cerebellar hemisphere with surrounding edema. -CT chest 09/04/2021-highly aggressive appearing partially cavitary right upper lobe mass. -CT head with and without contrast 09/07/2021-6-7 metastatic lesions identified with associated edema and regional mass effect -Biopsy of a right supraclavicular lymph node performed 09/07/2021-metastatic poorly differentiated lung adenocarcinoma, positive for TTF-1, negative for p63 and CK5/6; microsatellite stable, tumor mutation burden 22; PD-L1 TPS 45% -Brain radiation 09/13/2021 - 09/26/2021 -Cycle 1 Alimta/carboplatin/pembrolizumab 10/05/2021 Rheumatoid arthritis Expressive aphasia, right-sided weakness, and altered mental status-likely secondary to brain metastases, improved on Decadron History of tobacco and alcohol use Hyponatremia on a chemistry panel 08/15/2021    Disposition: Mr. Delpriore has completed 1 cycle of Alimta/carboplatin/Pembrolizumab.  He presents today with failure to thrive, constipation, increased arthritis pain.  He will receive a liter of IV fluids.  He will begin MiraLAX for the constipation.  For the arthritis pain he will be given oxycodone 2.5 mg while in the office.  If this is effective we will send a prescription to his pharmacy.  We are making a referral for home physical and occupational therapy evaluation.  He is scheduled to return for follow-up on 10/26/2021.  We are available to see him sooner if needed.  Patient seen with Dr. Benay Spice.    Ned Card ANP/GNP-BC   10/15/2021  9:36 AM  This was a shared visit with Ned Card.  Mr. Eckrich was interviewed and examined.  He is now at day 11 following cycle 1 of systemic therapy.  He  has multiple complaints today, chiefly increased arthritis pain.  It is possible his pain is related to a flare of rheumatoid  arthritis, but this is early to experience a flare related to pembrolizumab.  I was present for greater than 50% of today's visit.  I performed medical decision making.  Julieanne Manson, MD

## 2021-10-15 NOTE — Telephone Encounter (Signed)
A telephone advice: patient brother called in states his brother not feeling like he can make the appointment. He had his 1st chemo treatment 10/05/21. He is having stomach pain, leg pain, feel dehydrated, dry mouth, and headaches. He is drinking fluids. I called the patient no answer and left a message. The patient and his brother show up in the office asking to be seeing today.

## 2021-10-15 NOTE — Patient Instructions (Signed)
Rehydration, Adult Rehydration is the replacement of body fluids, salts, and minerals (electrolytes) that are lost during dehydration. Dehydration is when there is not enough water or other fluids in the body. This happens when you lose more fluids than you take in. Common causes of dehydration include: Not drinking enough fluids. This can occur when you are ill or doing activities that require a lot of energy, especially in hot weather. Conditions that cause loss of water or other fluids, such as diarrhea, vomiting, sweating, or urinating a lot. Other illnesses, such as fever or infection. Certain medicines, such as those that remove excess fluid from the body (diuretics). Symptoms of mild or moderate dehydration may include thirst, dry lips and mouth, and dizziness. Symptoms of severe dehydration may include increased heart rate, confusion, fainting, and not urinating. For severe dehydration, you may need to get fluids through an IV at the hospital. For mild or moderate dehydration, you can usually rehydrate at home by drinking certain fluids as told by your health care provider. What are the risks? Generally, rehydration is safe. However, taking in too much fluid (overhydration) can be a problem. This is rare. Overhydration can cause an electrolyte imbalance, kidney failure, or a decrease in salt (sodium) levels in the body. Supplies needed You will need an oral rehydration solution (ORS) if your health care provider tells you to use one. This is a drink to treat dehydration. It can be found in pharmacies and retail stores. How to rehydrate Fluids Follow instructions from your health care provider for rehydration. The kind of fluid and the amount you should drink depend on your condition. In general, you should choose drinks that you prefer. If told by your health care provider, drink an ORS. Make an ORS by following instructions on the package. Start by drinking small amounts, about  cup (120  mL) every 5-10 minutes. Slowly increase how much you drink until you have taken the amount recommended by your health care provider. Drink enough clear fluids to keep your urine pale yellow. If you were told to drink an ORS, finish it first, then start slowly drinking other clear fluids. Drink fluids such as: Water. This includes sparkling water and flavored water. Drinking only water can lead to having too little sodium in your body (hyponatremia). Follow the advice of your health care provider. Water from ice chips you suck on. Fruit juice with water you add to it (diluted). Sports drinks. Hot or cold herbal teas. Broth-based soups. Milk or milk products. Food Follow instructions from your health care provider about what to eat while you rehydrate. Your health care provider may recommend that you slowly begin eating regular foods in small amounts. Eat foods that contain a healthy balance of electrolytes, such as bananas, oranges, potatoes, tomatoes, and spinach. Avoid foods that are greasy or contain a lot of sugar. In some cases, you may get nutrition through a feeding tube that is passed through your nose and into your stomach (nasogastric tube, or NG tube). This may be done if you have uncontrolled vomiting or diarrhea. Beverages to avoid  Certain beverages may make dehydration worse. While you rehydrate, avoid drinking alcohol. How to tell if you are recovering from dehydration You may be recovering from dehydration if: You are urinating more often than before you started rehydrating. Your urine is pale yellow. Your energy level improves. You vomit less frequently. You have diarrhea less frequently. Your appetite improves or returns to normal. You feel less dizzy or less light-headed.   Your skin tone and color start to look more normal. Follow these instructions at home: Take over-the-counter and prescription medicines only as told by your health care provider. Do not take sodium  tablets. Doing this can lead to having too much sodium in your body (hypernatremia). Contact a health care provider if: You continue to have symptoms of mild or moderate dehydration, such as: Thirst. Dry lips. Slightly dry mouth. Dizziness. Dark urine or less urine than normal. Muscle cramps. You continue to vomit or have diarrhea. Get help right away if you: Have symptoms of dehydration that get worse. Have a fever. Have a severe headache. Have been vomiting and the following happens: Your vomiting gets worse or does not go away. Your vomit includes blood or green matter (bile). You cannot eat or drink without vomiting. Have problems with urination or bowel movements, such as: Diarrhea that gets worse or does not go away. Blood in your stool (feces). This may cause stool to look black and tarry. Not urinating, or urinating only a small amount of very dark urine, within 6-8 hours. Have trouble breathing. Have symptoms that get worse with treatment. These symptoms may represent a serious problem that is an emergency. Do not wait to see if the symptoms will go away. Get medical help right away. Call your local emergency services (911 in the [place] Do not drive yourself to the hospital. Summary Rehydration is the replacement of body fluids and minerals (electrolytes) that are lost during dehydration. Follow instructions from your health care provider for rehydration. The kind of fluid and amount you should drink depend on your condition. Slowly increase how much you drink until you have taken the amount recommended by your health care provider. Contact your health care provider if you continue to show signs of mild or moderate dehydration. This information is not intended to replace advice given to you by your health care provider. Make sure you discuss any questions you have with your health care provider. Document Revised: 05/26/2019 Document Reviewed: 04/05/2019 Elsevier Patient  Education  2023 [employer]  

## 2021-10-15 NOTE — Telephone Encounter (Signed)
Fax over a referral for home heath OT/PT to [facility] [phone].

## 2021-10-16 ENCOUNTER — Other Ambulatory Visit: Payer: Self-pay | Admitting: Radiation Oncology

## 2021-10-16 ENCOUNTER — Telehealth: Payer: Self-pay

## 2021-10-16 ENCOUNTER — Other Ambulatory Visit: Payer: Self-pay

## 2021-10-16 ENCOUNTER — Inpatient Hospital Stay: Payer: PPO

## 2021-10-16 ENCOUNTER — Inpatient Hospital Stay: Payer: PPO | Admitting: Nurse Practitioner

## 2021-10-16 DIAGNOSIS — C3490 Malignant neoplasm of unspecified part of unspecified bronchus or lung: Secondary | ICD-10-CM

## 2021-10-16 MED ORDER — DEXAMETHASONE 2 MG PO TABS: ORAL_TABLET | ORAL | 1 refills | Status: AC

## 2021-10-16 NOTE — Telephone Encounter (Signed)
Douglas Holt called and stated he isdoing  fine today. He have not had a bowel movement today. Patient's brother called in 3x to let us know he's working on getting his medication right. The brother was not sure how to refill his brother medication. He call the pharmacy and request a refill then called the office to also refill his brother medication. I spoke with the brother and advice him the next time he need a refill,he can call his brother pharmacy and request a refill or call the office one or the other not both. Patient's brother gave verbal understanding and had no other questions or concerns

## 2021-10-16 NOTE — Telephone Encounter (Signed)
Patient's brother called and request a refill of his Decadron. Prescription was sent in to [facility] on [address]

## 2021-10-16 NOTE — Telephone Encounter (Signed)
[facility] do not have staff to cover in the patient location. I send a new referral to [facility] [phone]

## 2021-10-17 ENCOUNTER — Telehealth: Payer: Self-pay

## 2021-10-17 NOTE — Telephone Encounter (Signed)
Patient 's brother called in and stated he still have  not had a bowel movement.  Per Lattie Haw  to continue to take the stool softener, increase his fluids, take a laxatives, or prunes. Patient's brother gave verbal understanding.

## 2021-10-22 ENCOUNTER — Telehealth (HOSPITAL_COMMUNITY): Payer: Self-pay | Admitting: Dietician

## 2021-10-22 NOTE — Telephone Encounter (Addendum)
Attempted to contact patient via telephone for nutrition follow-up. Left VM with request for return call. Contact information provided.   Received return call from brother. He reports pt appetite has been poor since starting chemotherapy. Patient is weak, often too exhausted to "even make an egg" Brother recalls breakfast is often a piece of toast with butter. Brother reports noticing pt had eaten some of the pie and Danishes a neighbor recently brought. Per brother pt went 2 weeks without having BM. This has resolved. He is taking miralax. Patient is drinking 3-4 Ensure. He enjoys these. Brother reports going to pt house a few times daily to let the dog out as pt is afraid of having a fall. Noted PT/OT referrals per 7/10 MD visit.    Medications: reviewed   Labs: 7/10 - Na 128, Cr 0.56  Anthropometrics: Last weight 113 lb on 7/10 decreased 5% in 10 days; significant  6/30 - 119 lb 3.2 oz   NUTRITION DIAGNOSIS: Food and nutrition related knowledge deficit continues   INTERVENTION:  Discussed importance of calories and protein energy intake to maintain weight/strength (encouraged glass of milk/ice cream with pie) Suggested keeping variety of heat/serve foods at home Continue drinking 3-4 Ensure Plus/equivalent  Will provide one complimentary case of Ensure Plus High Protein (pt to pick this up at 7/21 office visit - brother aware) Educated on strategies for constipation Continue daily bowel regimen per MD Will mail shake recipes, coupons, and handouts     MONITORING, EVALUATION, GOAL: weight trends, intake    NEXT VISIT: via telephone ~4 weeks

## 2021-10-25 ENCOUNTER — Other Ambulatory Visit: Payer: Self-pay | Admitting: Oncology

## 2021-10-26 ENCOUNTER — Inpatient Hospital Stay: Payer: PPO | Admitting: Oncology

## 2021-10-26 ENCOUNTER — Encounter: Payer: Self-pay | Admitting: *Deleted

## 2021-10-26 ENCOUNTER — Telehealth: Payer: Self-pay

## 2021-10-26 ENCOUNTER — Inpatient Hospital Stay: Payer: PPO

## 2021-10-26 ENCOUNTER — Other Ambulatory Visit: Payer: Self-pay

## 2021-10-26 VITALS — BP 112/82 | HR 101 | Temp 98.2°F | Resp 20

## 2021-10-26 VITALS — BP 107/58 | HR 102 | Temp 97.6°F | Resp 20 | Wt 111.6 lb

## 2021-10-26 DIAGNOSIS — C7931 Secondary malignant neoplasm of brain: Secondary | ICD-10-CM

## 2021-10-26 DIAGNOSIS — C3490 Malignant neoplasm of unspecified part of unspecified bronchus or lung: Secondary | ICD-10-CM

## 2021-10-26 DIAGNOSIS — Z5112 Encounter for antineoplastic immunotherapy: Secondary | ICD-10-CM | POA: Diagnosis not present

## 2021-10-26 LAB — CBC WITH DIFFERENTIAL (CANCER CENTER ONLY)
Abs Immature Granulocytes: 2 10*3/uL — ABNORMAL HIGH (ref 0.00–0.07)
Basophils Absolute: 0 10*3/uL (ref 0.0–0.1)
Basophils Relative: 0 %
Eosinophils Absolute: 0 10*3/uL (ref 0.0–0.5)
Eosinophils Relative: 0 %
HCT: 28.7 % — ABNORMAL LOW (ref 39.0–52.0)
Hemoglobin: 9.4 g/dL — ABNORMAL LOW (ref 13.0–17.0)
Lymphocytes Relative: 8 %
Lymphs Abs: 1.6 10*3/uL (ref 0.7–4.0)
MCH: 26.5 pg (ref 26.0–34.0)
MCHC: 32.8 g/dL (ref 30.0–36.0)
MCV: 80.8 fL (ref 80.0–100.0)
Metamyelocytes Relative: 5 %
Monocytes Absolute: 2.4 10*3/uL — ABNORMAL HIGH (ref 0.1–1.0)
Monocytes Relative: 12 %
Myelocytes: 4 %
Neutro Abs: 14.3 10*3/uL — ABNORMAL HIGH (ref 1.7–7.7)
Neutrophils Relative %: 70 %
Platelet Count: 813 10*3/uL — ABNORMAL HIGH (ref 150–400)
Promyelocytes Relative: 1 %
RBC: 3.55 MIL/uL — ABNORMAL LOW (ref 4.22–5.81)
RDW: 16.5 % — ABNORMAL HIGH (ref 11.5–15.5)
WBC Count: 20.4 10*3/uL — ABNORMAL HIGH (ref 4.0–10.5)
nRBC: 0.1 % (ref 0.0–0.2)

## 2021-10-26 LAB — CMP (CANCER CENTER ONLY)
ALT: 39 U/L (ref 0–44)
AST: 40 U/L (ref 15–41)
Albumin: 3.2 g/dL — ABNORMAL LOW (ref 3.5–5.0)
Alkaline Phosphatase: 93 U/L (ref 38–126)
Anion gap: 12 (ref 5–15)
BUN: 13 mg/dL (ref 6–20)
CO2: 24 mmol/L (ref 22–32)
Calcium: 9 mg/dL (ref 8.9–10.3)
Chloride: 94 mmol/L — ABNORMAL LOW (ref 98–111)
Creatinine: 0.59 mg/dL — ABNORMAL LOW (ref 0.61–1.24)
GFR, Estimated: >60 mL/min (ref >60)
Glucose, Bld: 106 mg/dL — ABNORMAL HIGH (ref 70–99)
Potassium: 3.9 mmol/L (ref 3.5–5.1)
Sodium: 130 mmol/L — ABNORMAL LOW (ref 135–145)
Total Bilirubin: 0.3 mg/dL (ref 0.3–1.2)
Total Protein: 6.7 g/dL (ref 6.5–8.1)

## 2021-10-26 MED ORDER — SODIUM CHLORIDE 0.9 % IV SOLN
10.0000 mg | Freq: Once | INTRAVENOUS | Status: AC
  Administered 2021-10-26: 10 mg via INTRAVENOUS
  Filled 2021-10-26: qty 1

## 2021-10-26 MED ORDER — SODIUM CHLORIDE 0.9 % IV SOLN: Freq: Once | INTRAVENOUS | Status: AC

## 2021-10-26 MED ORDER — SODIUM CHLORIDE 0.9 % IV SOLN
150.0000 mg | Freq: Once | INTRAVENOUS | Status: AC
  Administered 2021-10-26: 150 mg via INTRAVENOUS
  Filled 2021-10-26: qty 5

## 2021-10-26 MED ORDER — SODIUM CHLORIDE 0.9 % IV SOLN
200.0000 mg | Freq: Once | INTRAVENOUS | Status: AC
  Administered 2021-10-26: 200 mg via INTRAVENOUS
  Filled 2021-10-26: qty 8

## 2021-10-26 MED ORDER — SODIUM CHLORIDE 0.9 % IV SOLN
500.0000 mg/m2 | Freq: Once | INTRAVENOUS | Status: AC
  Administered 2021-10-26: 800 mg via INTRAVENOUS
  Filled 2021-10-26: qty 12

## 2021-10-26 MED ORDER — SODIUM CHLORIDE 0.9 % IV SOLN
384.8000 mg | Freq: Once | INTRAVENOUS | Status: AC
  Administered 2021-10-26: 380 mg via INTRAVENOUS
  Filled 2021-10-26: qty 38

## 2021-10-26 MED ORDER — PALONOSETRON HCL INJECTION 0.25 MG/5ML
0.2500 mg | Freq: Once | INTRAVENOUS | Status: AC
  Administered 2021-10-26: 0.25 mg via INTRAVENOUS
  Filled 2021-10-26: qty 5

## 2021-10-26 NOTE — Telephone Encounter (Signed)
Dr Benay Spice requested Ferritin today could not be added to today's labs. Ferritin ordered for next visit.

## 2021-10-26 NOTE — Progress Notes (Signed)
Patient seen by Dr. Benay Spice today  Vitals are within treatment parameters. Pulse = 115/ repeat 102 and slightly irregular--OK to treat per Dr. Benay Spice Labs reviewed by Dr. Benay Spice and are within treatment parameters.  Per physician team, patient is ready for treatment and there are NO modifications to the treatment plan.

## 2021-10-26 NOTE — Patient Instructions (Signed)
Douglas Holt   Discharge Instructions: Thank you for choosing [facility] to provide your oncology and hematology care.   If you have a lab appointment with the [facility], please go directly to the [facility] and check in at the registration area.   Wear comfortable clothing and clothing appropriate for easy access to any Portacath or PICC line.   We strive to give you quality time with your provider. You may need to reschedule your appointment if you arrive late (15 or more minutes).  Arriving late affects you and other patients whose appointments are after yours.  Also, if you miss three or more appointments without notifying the office, you may be dismissed from the clinic at the provider's discretion.      For prescription refill requests, have your pharmacy contact our office and allow 72 hours for refills to be completed.    Today you received the following chemotherapy and/or immunotherapy agents Pembrolizumab (KEYTRUDA). Pemetrexed (ALIMTA) & Carboplatin (PARAPLATIN).      To help prevent nausea and vomiting after your treatment, we encourage you to take your nausea medication as directed.  BELOW ARE SYMPTOMS THAT SHOULD BE REPORTED IMMEDIATELY: *FEVER GREATER THAN 100.4 F (38 C) OR HIGHER *CHILLS OR SWEATING *NAUSEA AND VOMITING THAT IS NOT CONTROLLED WITH YOUR NAUSEA MEDICATION *UNUSUAL SHORTNESS OF BREATH *UNUSUAL BRUISING OR BLEEDING *URINARY PROBLEMS (pain or burning when urinating, or frequent urination) *BOWEL PROBLEMS (unusual diarrhea, constipation, pain near the anus) TENDERNESS IN MOUTH AND THROAT WITH OR WITHOUT PRESENCE OF ULCERS (sore throat, sores in mouth, or a toothache) UNUSUAL RASH, SWELLING OR PAIN  UNUSUAL VAGINAL DISCHARGE OR ITCHING   Items with * indicate a potential emergency and should be followed up as soon as possible or go to the Emergency Department if any problems should occur.  Please show the  CHEMOTHERAPY ALERT CARD or IMMUNOTHERAPY ALERT CARD at check-in to the Emergency Department and triage nurse.  Should you have questions after your visit or need to cancel or reschedule your appointment, please contact [facility]  Dept: [phone]  and follow the prompts.  Office hours are 8:00 a.m. to 4:30 p.m. Monday - Friday. Please note that voicemails left after 4:00 p.m. may not be returned until the following business day.  We are closed weekends and major holidays. You have access to a nurse at all times for urgent questions. Please call the main number to the clinic Dept: [phone] and follow the prompts.   For any non-urgent questions, you may also contact your provider using MyChart. We now offer e-Visits for anyone 70 and older to request care online for non-urgent symptoms. For details visit [email or web address].   Also download the MyChart app! Go to the app store, search "MyChart", open the app, select [facility], and log in with your MyChart username and password.  Masks are optional in the cancer centers. If you would like for your care team to wear a mask while they are taking care of you, please let them know. For doctor visits, patients may have with them one support person who is at least 60 years old. At this time, visitors are not allowed in the infusion area.  Pembrolizumab injection What is this medication? PEMBROLIZUMAB (pem broe liz ue mab) is a monoclonal antibody. It is used to treat certain types of cancer. This medicine may be used for other purposes; ask your health care provider or pharmacist if you have  questions. COMMON BRAND NAME(S): Keytruda What should I tell my care team before I take this medication? They need to know if you have any of these conditions: autoimmune diseases like Crohn's disease, ulcerative colitis, or lupus have had or planning to have an allogeneic stem cell transplant (uses someone else's stem  cells) history of organ transplant history of chest radiation nervous system problems like myasthenia gravis or Guillain-Barre syndrome an unusual or allergic reaction to pembrolizumab, other medicines, foods, dyes, or preservatives pregnant or trying to get pregnant breast-feeding How should I use this medication? This medicine is for infusion into a vein. It is given by a health care professional in a hospital or clinic setting. A special MedGuide will be given to you before each treatment. Be sure to read this information carefully each time. Talk to your pediatrician regarding the use of this medicine in children. While this drug may be prescribed for children as young as 6 months for selected conditions, precautions do apply. Overdosage: If you think you have taken too much of this medicine contact a poison control center or emergency room at once. NOTE: This medicine is only for you. Do not share this medicine with others. What if I miss a dose? It is important not to miss your dose. Call your doctor or health care professional if you are unable to keep an appointment. What may interact with this medication? Interactions have not been studied. This list may not describe all possible interactions. Give your health care provider a list of all the medicines, herbs, non-prescription drugs, or dietary supplements you use. Also tell them if you smoke, drink alcohol, or use illegal drugs. Some items may interact with your medicine. What should I watch for while using this medication? Your condition will be monitored carefully while you are receiving this medicine. You may need blood work done while you are taking this medicine. Do not become pregnant while taking this medicine or for 4 months after stopping it. Women should inform their doctor if they wish to become pregnant or think they might be pregnant. There is a potential for serious side effects to an unborn child. Talk to your health care  professional or pharmacist for more information. Do not breast-feed an infant while taking this medicine or for 4 months after the last dose. What side effects may I notice from receiving this medication? Side effects that you should report to your doctor or health care professional as soon as possible: allergic reactions like skin rash, itching or hives, swelling of the face, lips, or tongue bloody or black, tarry breathing problems changes in vision chest pain chills confusion constipation cough diarrhea dizziness or feeling faint or lightheaded fast or irregular heartbeat fever flushing joint pain low blood counts - this medicine may decrease the number of white blood cells, red blood cells and platelets. You may be at increased risk for infections and bleeding. muscle pain muscle weakness pain, tingling, numbness in the hands or feet persistent headache redness, blistering, peeling or loosening of the skin, including inside the mouth signs and symptoms of high blood sugar such as dizziness; dry mouth; dry skin; fruity breath; nausea; stomach pain; increased hunger or thirst; increased urination signs and symptoms of kidney injury like trouble passing urine or change in the amount of urine signs and symptoms of liver injury like dark urine, light-colored stools, loss of appetite, nausea, right upper belly pain, yellowing of the eyes or skin sweating swollen lymph nodes weight loss Side effects  that usually do not require medical attention (report to your doctor or health care professional if they continue or are bothersome): decreased appetite hair loss tiredness This list may not describe all possible side effects. Call your doctor for medical advice about side effects. You may report side effects to FDA at 1-800-FDA-1088. Where should I keep my medication? This drug is given in a hospital or clinic and will not be stored at home. NOTE: This sheet is a summary. It may not  cover all possible information. If you have questions about this medicine, talk to your doctor, pharmacist, or health care provider.  2023 Elsevier/Gold Standard (2021-02-23 00:00:00) Pemetrexed injection What is this medication? PEMETREXED (PEM e TREX ed) is a chemotherapy drug used to treat lung cancers like non-small cell lung cancer and mesothelioma. It may also be used to treat other cancers. This medicine may be used for other purposes; ask your health care provider or pharmacist if you have questions. COMMON BRAND NAME(S): Alimta, PEMFEXY What should I tell my care team before I take this medication? They need to know if you have any of these conditions: infection (especially a virus infection such as chickenpox, cold sores, or herpes) kidney disease low blood counts, like low white cell, platelet, or red cell counts lung or breathing disease, like asthma radiation therapy an unusual or allergic reaction to pemetrexed, other medicines, foods, dyes, or preservative pregnant or trying to get pregnant breast-feeding How should I use this medication? This drug is given as an infusion into a vein. It is administered in a hospital or clinic by a specially trained health care professional. Talk to your pediatrician regarding the use of this medicine in children. Special care may be needed. Overdosage: If you think you have taken too much of this medicine contact a poison control center or emergency room at once. NOTE: This medicine is only for you. Do not share this medicine with others. What if I miss a dose? It is important not to miss your dose. Call your doctor or health care professional if you are unable to keep an appointment. What may interact with this medication? This medicine may interact with the following medications: Ibuprofen This list may not describe all possible interactions. Give your health care provider a list of all the medicines, herbs, non-prescription drugs, or  dietary supplements you use. Also tell them if you smoke, drink alcohol, or use illegal drugs. Some items may interact with your medicine. What should I watch for while using this medication? Visit your doctor for checks on your progress. This drug may make you feel generally unwell. This is not uncommon, as chemotherapy can affect healthy cells as well as cancer cells. Report any side effects. Continue your course of treatment even though you feel ill unless your doctor tells you to stop. In some cases, you may be given additional medicines to help with side effects. Follow all directions for their use. Call your doctor or health care professional for advice if you get a fever, chills or sore throat, or other symptoms of a cold or flu. Do not treat yourself. This drug decreases your body's ability to fight infections. Try to avoid being around people who are sick. This medicine may increase your risk to bruise or bleed. Call your doctor or health care professional if you notice any unusual bleeding. Be careful brushing and flossing your teeth or using a toothpick because you may get an infection or bleed more easily. If you have any  dental work done, tell your dentist you are receiving this medicine. Avoid taking products that contain aspirin, acetaminophen, ibuprofen, naproxen, or ketoprofen unless instructed by your doctor. These medicines may hide a fever. Call your doctor or health care professional if you get diarrhea or mouth sores. Do not treat yourself. To protect your kidneys, drink water or other fluids as directed while you are taking this medicine. Do not become pregnant while taking this medicine or for 6 months after stopping it. Women should inform their doctor if they wish to become pregnant or think they might be pregnant. Men should not father a child while taking this medicine and for 3 months after stopping it. This may interfere with the ability to father a child. You should talk to  your doctor or health care professional if you are concerned about your fertility. There is a potential for serious side effects to an unborn child. Talk to your health care professional or pharmacist for more information. Do not breast-feed an infant while taking this medicine or for 1 week after stopping it. What side effects may I notice from receiving this medication? Side effects that you should report to your doctor or health care professional as soon as possible: allergic reactions like skin rash, itching or hives, swelling of the face, lips, or tongue breathing problems redness, blistering, peeling or loosening of the skin, including inside the mouth signs and symptoms of bleeding such as bloody or black, tarry stools; red or dark-brown urine; spitting up blood or brown material that looks like coffee grounds; red spots on the skin; unusual bruising or bleeding from the eye, gums, or nose signs and symptoms of infection like fever or chills; cough; sore throat; pain or trouble passing urine signs and symptoms of kidney injury like trouble passing urine or change in the amount of urine signs and symptoms of liver injury like dark yellow or brown urine; general ill feeling or flu-like symptoms; light-colored stools; loss of appetite; nausea; right upper belly pain; unusually weak or tired; yellowing of the eyes or skin Side effects that usually do not require medical attention (report to your doctor or health care professional if they continue or are bothersome): constipation mouth sores nausea, vomiting unusually weak or tired This list may not describe all possible side effects. Call your doctor for medical advice about side effects. You may report side effects to FDA at 1-800-FDA-1088. Where should I keep my medication? This drug is given in a hospital or clinic and will not be stored at home. NOTE: This sheet is a summary. It may not cover all possible information. If you have questions  about this medicine, talk to your doctor, pharmacist, or health care provider.  2023 Elsevier/Gold Standard (2017-05-20 00:00:00) Carboplatin injection What is this medication? CARBOPLATIN (KAR boe pla tin) is a chemotherapy drug. It targets fast dividing cells, like cancer cells, and causes these cells to die. This medicine is used to treat ovarian cancer and many other cancers. This medicine may be used for other purposes; ask your health care provider or pharmacist if you have questions. COMMON BRAND NAME(S): Paraplatin What should I tell my care team before I take this medication? They need to know if you have any of these conditions: blood disorders hearing problems kidney disease recent or ongoing radiation therapy an unusual or allergic reaction to carboplatin, cisplatin, other chemotherapy, other medicines, foods, dyes, or preservatives pregnant or trying to get pregnant breast-feeding How should I use this medication? This drug  is usually given as an infusion into a vein. It is administered in a hospital or clinic by a specially trained health care professional. Talk to your pediatrician regarding the use of this medicine in children. Special care may be needed. Overdosage: If you think you have taken too much of this medicine contact a poison control center or emergency room at once. NOTE: This medicine is only for you. Do not share this medicine with others. What if I miss a dose? It is important not to miss a dose. Call your doctor or health care professional if you are unable to keep an appointment. What may interact with this medication? medicines for seizures medicines to increase blood counts like filgrastim, pegfilgrastim, sargramostim some antibiotics like amikacin, gentamicin, neomycin, streptomycin, tobramycin vaccines Talk to your doctor or health care professional before taking any of these medicines: acetaminophen aspirin ibuprofen ketoprofen naproxen This  list may not describe all possible interactions. Give your health care provider a list of all the medicines, herbs, non-prescription drugs, or dietary supplements you use. Also tell them if you smoke, drink alcohol, or use illegal drugs. Some items may interact with your medicine. What should I watch for while using this medication? Your condition will be monitored carefully while you are receiving this medicine. You will need important blood work done while you are taking this medicine. This drug may make you feel generally unwell. This is not uncommon, as chemotherapy can affect healthy cells as well as cancer cells. Report any side effects. Continue your course of treatment even though you feel ill unless your doctor tells you to stop. In some cases, you may be given additional medicines to help with side effects. Follow all directions for their use. Call your doctor or health care professional for advice if you get a fever, chills or sore throat, or other symptoms of a cold or flu. Do not treat yourself. This drug decreases your body's ability to fight infections. Try to avoid being around people who are sick. This medicine may increase your risk to bruise or bleed. Call your doctor or health care professional if you notice any unusual bleeding. Be careful brushing and flossing your teeth or using a toothpick because you may get an infection or bleed more easily. If you have any dental work done, tell your dentist you are receiving this medicine. Avoid taking products that contain aspirin, acetaminophen, ibuprofen, naproxen, or ketoprofen unless instructed by your doctor. These medicines may hide a fever. Do not become pregnant while taking this medicine. Women should inform their doctor if they wish to become pregnant or think they might be pregnant. There is a potential for serious side effects to an unborn child. Talk to your health care professional or pharmacist for more information. Do not  breast-feed an infant while taking this medicine. What side effects may I notice from receiving this medication? Side effects that you should report to your doctor or health care professional as soon as possible: allergic reactions like skin rash, itching or hives, swelling of the face, lips, or tongue signs of infection - fever or chills, cough, sore throat, pain or difficulty passing urine signs of decreased platelets or bleeding - bruising, pinpoint red spots on the skin, black, tarry stools, nosebleeds signs of decreased red blood cells - unusually weak or tired, fainting spells, lightheadedness breathing problems changes in hearing changes in vision chest pain high blood pressure low blood counts - This drug may decrease the number of white blood cells,  red blood cells and platelets. You may be at increased risk for infections and bleeding. nausea and vomiting pain, swelling, redness or irritation at the injection site pain, tingling, numbness in the hands or feet problems with balance, talking, walking trouble passing urine or change in the amount of urine Side effects that usually do not require medical attention (report to your doctor or health care professional if they continue or are bothersome): hair loss loss of appetite metallic taste in the mouth or changes in taste This list may not describe all possible side effects. Call your doctor for medical advice about side effects. You may report side effects to FDA at 1-800-FDA-1088. Where should I keep my medication? This drug is given in a hospital or clinic and will not be stored at home. NOTE: This sheet is a summary. It may not cover all possible information. If you have questions about this medicine, talk to your doctor, pharmacist, or health care provider.  2023 Elsevier/Gold Standard (2007-09-02 00:00:00)

## 2021-10-26 NOTE — Progress Notes (Signed)
[facility] OFFICE PROGRESS NOTE   Diagnosis: Non-small cell lung cancer  INTERVAL HISTORY:   Douglas Holt returns as scheduled.  He is here with his brother.  He continues to live alone.  He reports early satiety.  He complains of knee and ankle pain.  He is taking Decadron at a dose of 1 mg daily.  No rash or diarrhea.  He has been constipated.  Objective:  Vital signs in last 24 hours:  Blood pressure (!) 107/58, pulse (!) 115, temperature 97.6 F (36.4 C), temperature source Oral, resp. rate 20, weight 111 lb 9.6 oz (50.6 kg), SpO2 100 %.    HEENT: No thrush or ulcers Resp: Lungs clear bilaterally, no respiratory distress Cardio: Regular rate and rhythm GI: No hepatosplenomegaly Vascular: No leg edema Musculoskeletal: Knees without effusions Skin: No rash Neurologic: Alert, follows commands, the motor exam appears intact in the upper and lower extremities bilaterally, partial expressive aphasia   Lab Results:  Lab Results  Component Value Date   WBC 20.4 (H) 10/26/2021   HGB 9.4 (L) 10/26/2021   HCT 28.7 (L) 10/26/2021   MCV 80.8 10/26/2021   PLT 813 (H) 10/26/2021   NEUTROABS 14.3 (H) 10/26/2021    CMP  Lab Results  Component Value Date   NA 130 (L) 10/26/2021   K 3.9 10/26/2021   CL 94 (L) 10/26/2021   CO2 24 10/26/2021   GLUCOSE 106 (H) 10/26/2021   BUN 13 10/26/2021   CREATININE 0.59 (L) 10/26/2021   CALCIUM 9.0 10/26/2021   PROT 6.7 10/26/2021   ALBUMIN 3.2 (L) 10/26/2021   AST 40 10/26/2021   ALT 39 10/26/2021   ALKPHOS 93 10/26/2021   BILITOT 0.3 10/26/2021   GFRNONAA >60 10/26/2021   GFRAA >90 09/30/2011    Medications: I have reviewed the patient's current medications.   Assessment/Plan: Metastatic non-small cell lung cancer with neck adenopathy and brain metastases.   -CT soft tissue neck 09/05/1998 23-1.6 x 1.1 cm hyperenhancing and centrally cystic/necrotic right level 2/3 lymph node consistent with nodal metastatic  disease, prominent metastatic lymphadenopathy in the right lower neck, right retroclavicular region, right axilla, and mediastinum, lesions in the right paraspinal musculature at C2-C3 and T2-T3, 2.4 x 2.0 cm enhancing intracranial metastasis with the left temporal lobe with prominent surrounding edema, 2.6 x 2.5 cm peripherally enhancing metastasis within the medial right cerebellar hemisphere with surrounding edema. -CT chest 09/04/2021-highly aggressive appearing partially cavitary right upper lobe mass. -CT head with and without contrast 09/07/2021-6-7 metastatic lesions identified with associated edema and regional mass effect -Biopsy of a right supraclavicular lymph node performed 09/07/2021-metastatic poorly differentiated lung adenocarcinoma, positive for TTF-1, negative for p63 and CK5/6; microsatellite stable, tumor mutation burden 22; PD-L1 TPS 45% -Brain radiation 09/13/2021 - 09/26/2021 -Cycle 1 Alimta/carboplatin/pembrolizumab 10/05/2021 -Cycle 2 Alimta/carboplatin/pembrolizumab 10/26/2021 Rheumatoid arthritis Expressive aphasia, right-sided weakness, and altered mental status-likely secondary to brain metastases, improved on Decadron History of tobacco and alcohol use Hyponatremia on a chemistry panel 08/15/2021      Disposition: Douglas Holt appears unchanged.  He tolerated the first cycle of systemic therapy well.  He will complete cycle 2 today.  We discussed physical therapy.  He does not wish to begin a physical therapy program at present.  He will return for an office visit in the next cycle of Alimta/carboplatin/pembrolizumab in 3 weeks.  We will check the TSH when he returns in 3 weeks.  The plan is to schedule a restaging CT evaluation after cycle 3  or cycle 4 of systemic therapy.  Chemotherapy was dose adjusted for weight loss today.  Betsy Coder, MD  10/26/2021  11:36 AM

## 2021-10-29 ENCOUNTER — Other Ambulatory Visit: Payer: Self-pay

## 2021-11-05 ENCOUNTER — Ambulatory Visit
Admission: RE | Admit: 2021-11-05 | Discharge: 2021-11-05 | Disposition: A | Discharge status: Home / Self Care | Payer: PPO | Source: Ambulatory Visit | Attending: Oncology | Admitting: Oncology

## 2021-11-05 DIAGNOSIS — C3490 Malignant neoplasm of unspecified part of unspecified bronchus or lung: Secondary | ICD-10-CM

## 2021-11-05 DIAGNOSIS — C7931 Secondary malignant neoplasm of brain: Secondary | ICD-10-CM

## 2021-11-06 ENCOUNTER — Other Ambulatory Visit: Payer: Self-pay

## 2021-11-06 DEATH — deceased

## 2021-11-11 ENCOUNTER — Other Ambulatory Visit: Payer: Self-pay | Admitting: Oncology

## 2021-11-16 ENCOUNTER — Inpatient Hospital Stay: Payer: PPO

## 2021-11-16 ENCOUNTER — Inpatient Hospital Stay: Payer: PPO | Admitting: Oncology

## 2021-12-07 NOTE — Progress Notes (Signed)
  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  Intent: Palliative  Radiation Treatment Dates: 09/13/2021 through 09/26/2021 Site Technique Total Dose (Gy) Dose per Fx (Gy) Completed Fx Beam Energies  Brain: Brain Complex 30/30 3 10/10 6X   Narrative: The patient tolerated radiation therapy relatively well.     Impression/Plan: 1. 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 I called and his brother [name] and he confirmed that [name] was found deceased at home on 11-22-2021. I expressed our condolences and encourage him to call if we could help in answering questions about his brother's care.     Carola Rhine, PAC

## 2021-12-14 NOTE — Telephone Encounter (Signed)
error 

## 2024-03-25 IMAGING — CT CT CHEST LUNG CANCER SCREENING LOW DOSE W/O CM
2 of 5 series · 14 of 40 positions shown, 17 images · non-contrast
Comparison: No priors.

CLINICAL DATA: 60-year-old male current smoker with 120 pack-year
history of smoking. Lung cancer screening examination.



[Series 4: lung 1.00 br44 cor · coronal · 0.59mm/px · 3 of 265 slices shown]
[im 53/265  lung]
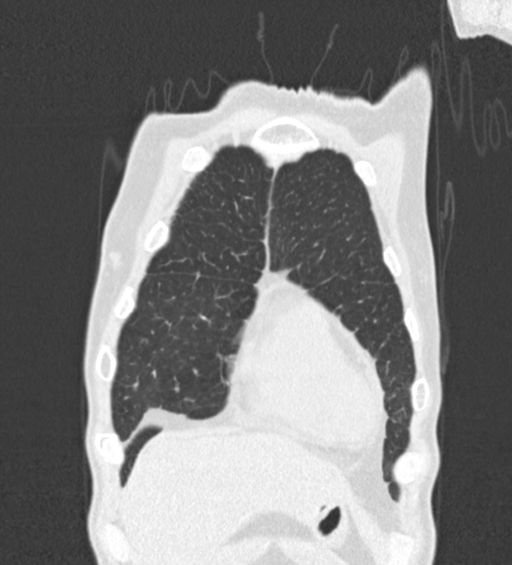
[im 106/265  lung]
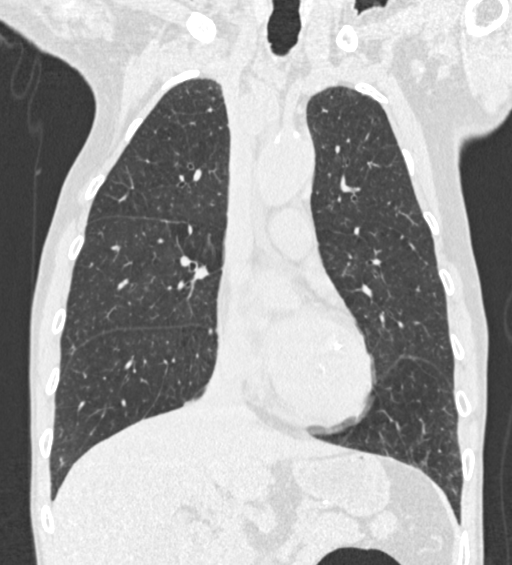
[im 159/265  lung]
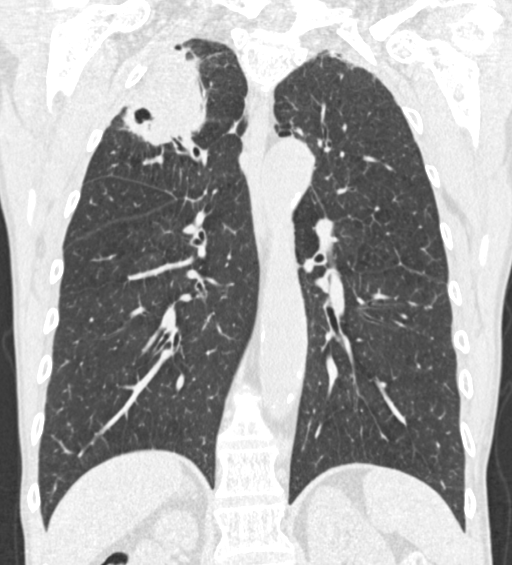

[Series 9: lung 1.00 br60 axial · axial · 0.58mm/px · z∈[-1109,-806]mm · 11 of 335 slices shown, 14 images]
[im 16/335  mediastinal]
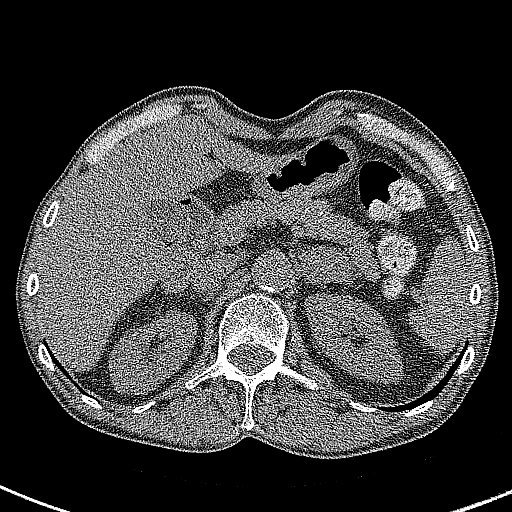
[im 16/335  lung]
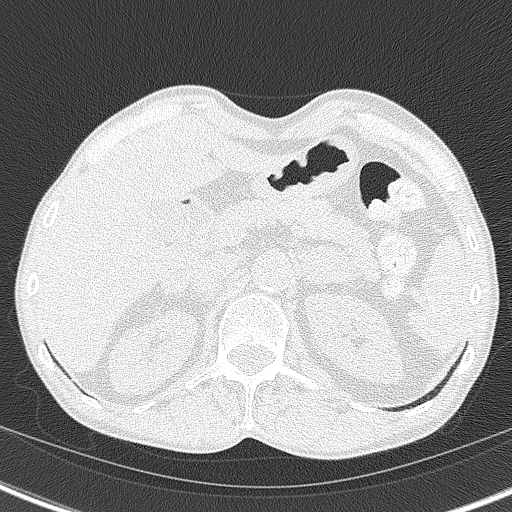
[im 46/335  lung]
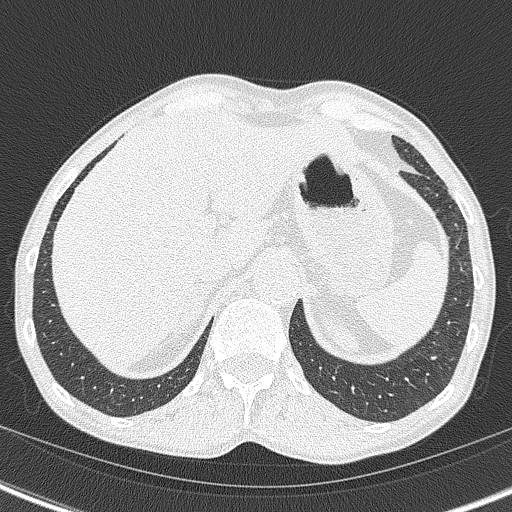
[im 76/335  lung]
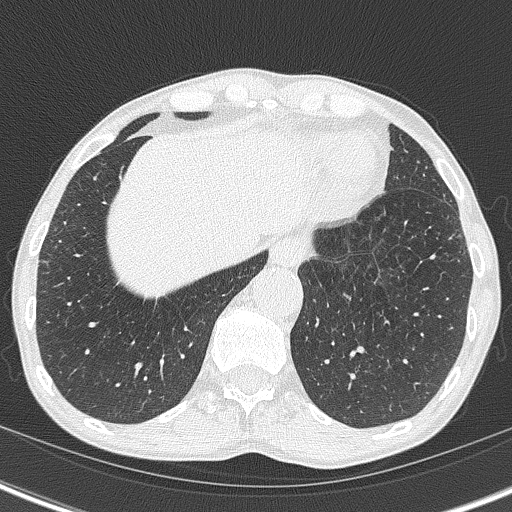
[im 107/335  lung]
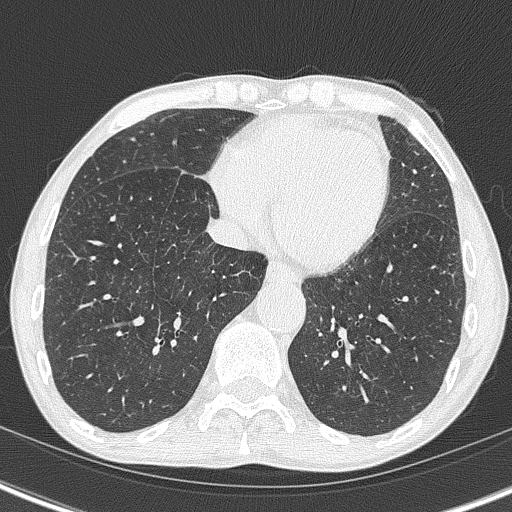
[im 137/335  mediastinal]
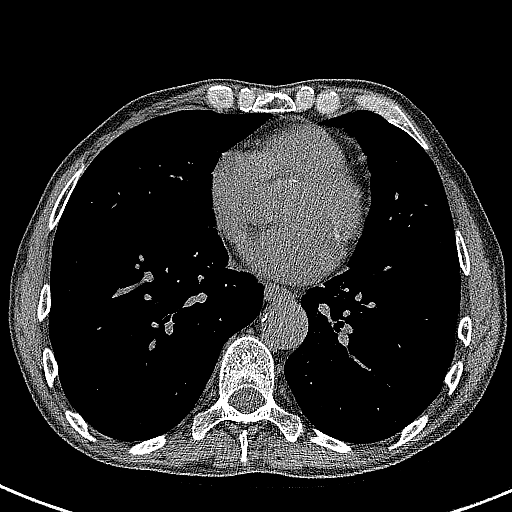
[im 137/335  lung]
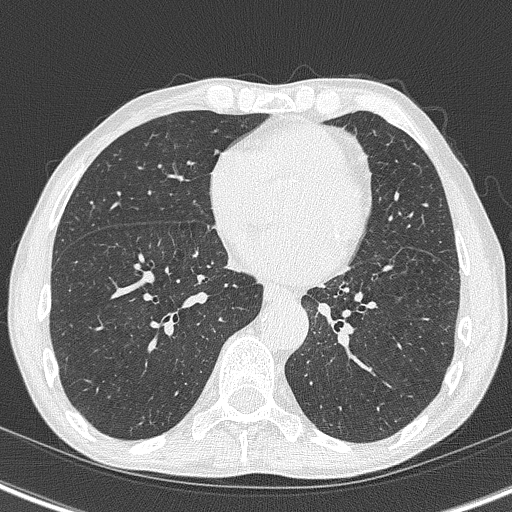
[im 168/335  lung]
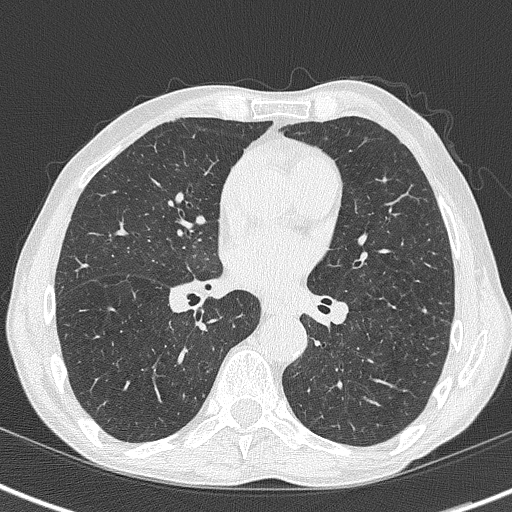
[im 198/335  lung]
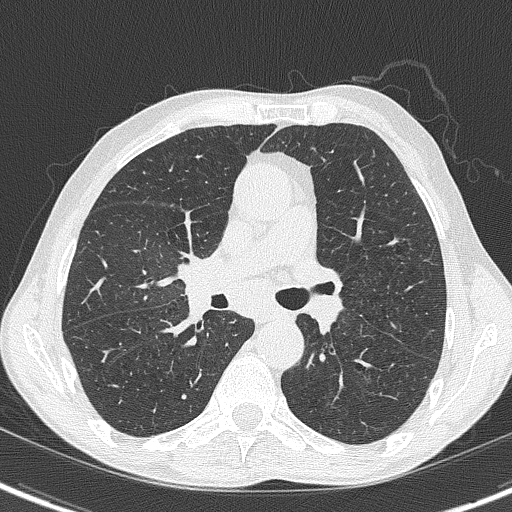
[im 228/335  lung]
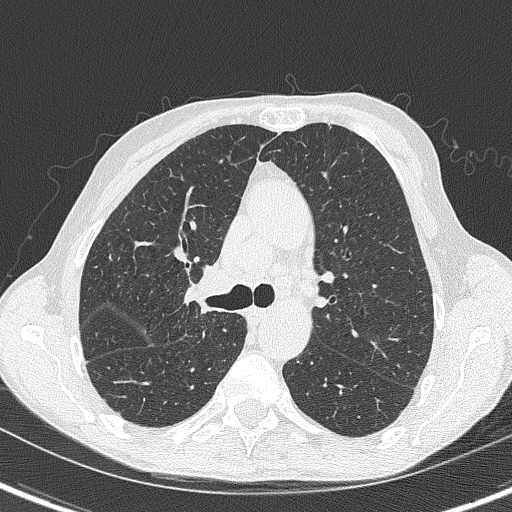
[im 259/335  mediastinal]
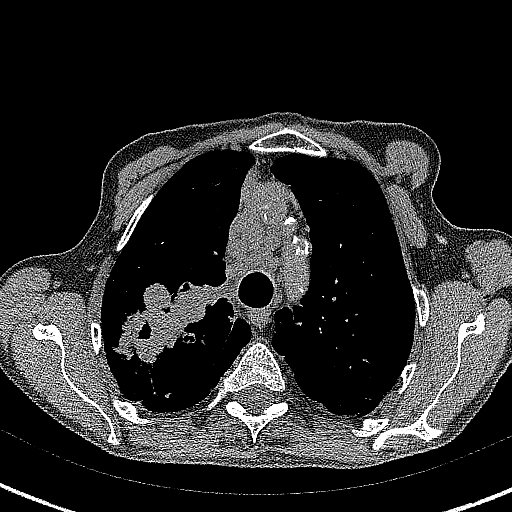
[im 259/335  lung]
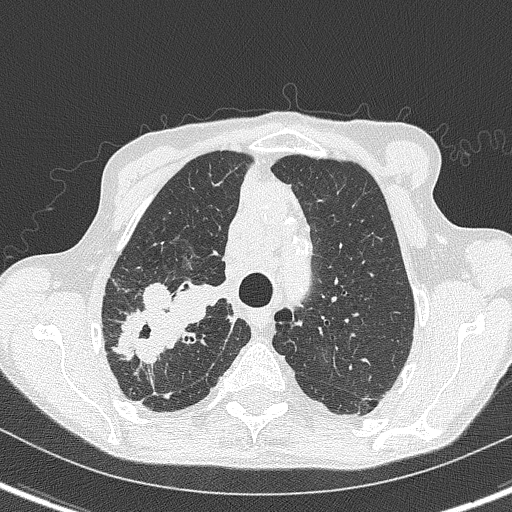
[im 289/335  lung]
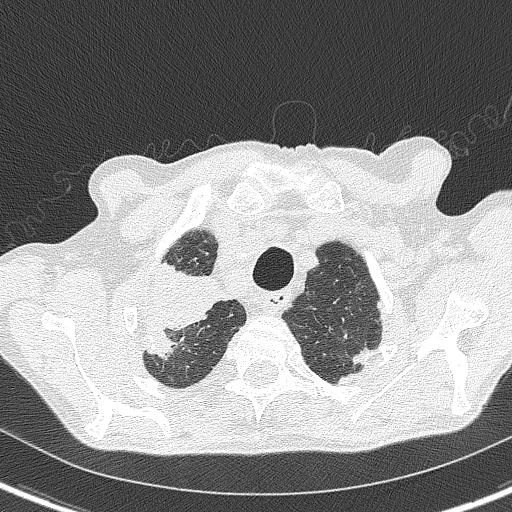
[im 319/335  lung]
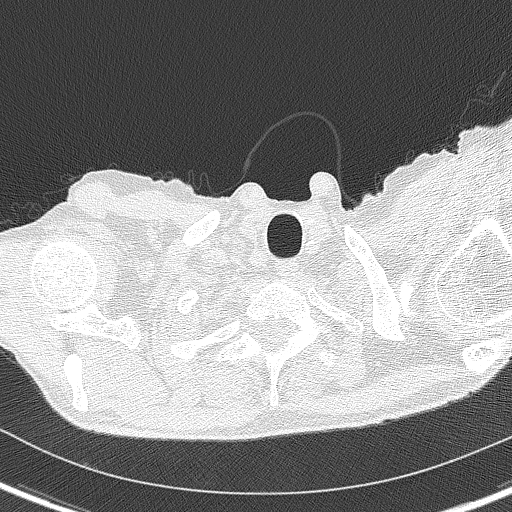

[14 of 40 positions shown; findings below may reference images not displayed]

FINDINGS: Cardiovascular: Heart size is normal. There is no significant
pericardial fluid, thickening or pericardial calcification. There is
aortic atherosclerosis, as well as atherosclerosis of the great
vessels of the mediastinum and the coronary arteries, including
calcified atherosclerotic plaque in the left anterior descending and
right coronary arteries. Mild calcifications of the mitral annulus.

Mediastinum/Nodes: Bulky subcarinal lymphadenopathy measuring up to
2.2 cm in short axis. Fullness in the right hilar region poorly
evaluated on today's noncontrast CT examination, but suspicious for
right hilar lymphadenopathy. Bulky low right paratracheal lymph node
measuring 2.2 cm in short axis. Other high right paratracheal
lymphadenopathy measuring up to 1.8 cm in short axis in the superior
mediastinum. Esophagus is unremarkable in appearance. No axillary
lymphadenopathy.

Lungs/Pleura: Large macrolobulated partially cavitary spiculated
right upper lobe mass (axial image 70) with a volume derived mean
diameter of 6.2 cm, highly concerning for primary bronchogenic
carcinoma such as a squamous cell carcinoma. Several other smaller
pulmonary nodules are also noted. No acute consolidative airspace
disease. No pleural effusions. Mild diffuse bronchial wall
thickening with mild centrilobular and paraseptal emphysema.

Upper Abdomen: Aortic atherosclerosis. 2.2 x 1.2 cm intermediate
attenuation (29 HU) right adrenal nodule. 3.1 x 2.4 cm intermediate
attenuation (37 HU) left adrenal mass.

Musculoskeletal: There are no aggressive appearing lytic or blastic
lesions noted in the visualized portions of the skeleton.
IMPRESSION: 1. Highly aggressive appearing partially cavitary right upper lobe
mass which almost certainly represents a primary squamous cell
carcinoma of the lung, categorized as Lung-RADS 4XS, highly
suspicious. This is associated with probable right hilar and
mediastinal lymphadenopathy, as well as bilateral adrenal nodules
which may represent metastatic lesions. Additional imaging
evaluation or consultation with Pulmonology or Thoracic Surgery
recommended.
2. The "S" modifier above refers to potentially clinically
significant non lung cancer related findings. Specifically, there is
aortic atherosclerosis, in addition to two-vessel coronary artery
disease. Please note that although the presence of coronary artery
calcium documents the presence of coronary artery disease, the
severity of this disease and any potential stenosis cannot be
assessed on this non-gated CT examination. Assessment for potential
risk factor modification, dietary therapy or pharmacologic therapy
may be warranted, if clinically indicated.
3. Mild diffuse bronchial wall thickening with mild centrilobular
and paraseptal emphysema; imaging findings suggestive of underlying
COPD.
4. There are calcifications of the mitral annulus. Echocardiographic
correlation for evaluation of potential valvular dysfunction may be
warranted if clinically indicated.

These results will be called to the ordering clinician or
representative by the Radiologist Assistant, and communication
documented in the PACS or [REDACTED].

Aortic Atherosclerosis ([id number]) and Emphysema ([id number]).
